# Patient Record
Sex: Female | Born: 1990 | Race: White | Hispanic: No | Marital: Single | Smoking: Former smoker
Health system: Southern US, Community
[De-identification: ages and names within clinical notes are randomized; demographics above are authoritative.]

## PROBLEM LIST (undated history)

## (undated) DIAGNOSIS — R87619 Unspecified abnormal cytological findings in specimens from cervix uteri: Secondary | ICD-10-CM

## (undated) DIAGNOSIS — F419 Anxiety disorder, unspecified: Secondary | ICD-10-CM

## (undated) HISTORY — PX: WISDOM TOOTH EXTRACTION: SHX21

## (undated) HISTORY — DX: Anxiety disorder, unspecified: F41.9

## (undated) HISTORY — DX: Unspecified abnormal cytological findings in specimens from cervix uteri: R87.619

---

## 2015-01-28 ENCOUNTER — Encounter: Payer: Self-pay | Admitting: Obstetrics & Gynecology

## 2015-01-28 ENCOUNTER — Ambulatory Visit (INDEPENDENT_AMBULATORY_CARE_PROVIDER_SITE_OTHER): Payer: Managed Care, Other (non HMO) | Admitting: Obstetrics & Gynecology

## 2015-01-28 VITALS — BP 103/57 | HR 82 | Resp 16 | Ht 65.0 in | Wt 126.0 lb

## 2015-01-28 DIAGNOSIS — Z30431 Encounter for routine checking of intrauterine contraceptive device: Secondary | ICD-10-CM

## 2015-01-28 DIAGNOSIS — Z113 Encounter for screening for infections with a predominantly sexual mode of transmission: Secondary | ICD-10-CM | POA: Diagnosis not present

## 2015-01-28 DIAGNOSIS — Z124 Encounter for screening for malignant neoplasm of cervix: Secondary | ICD-10-CM

## 2015-01-28 DIAGNOSIS — Z01419 Encounter for gynecological examination (general) (routine) without abnormal findings: Secondary | ICD-10-CM | POA: Diagnosis not present

## 2015-01-28 DIAGNOSIS — Z1151 Encounter for screening for human papillomavirus (HPV): Secondary | ICD-10-CM

## 2015-01-28 DIAGNOSIS — Z0000 Encounter for general adult medical examination without abnormal findings: Secondary | ICD-10-CM

## 2015-01-28 MED ORDER — OXYCODONE-ACETAMINOPHEN 5-325 MG PO TABS: 1.0000 | ORAL_TABLET | ORAL | Status: DC | PRN

## 2015-01-28 NOTE — Progress Notes (Signed)
Subjective:    Rebecca Downs is a 24 y.o. SW P1 P57 (1 yo son) female who presents for an annual exam. The patient has no complaints today.She has cramping for the last few months and bleeding with sex but she thinks this is due to her Mirena.  The patient is sexually active. GYN screening history: last pap: was normal. The patient wears seatbelts: yes. The patient participates in regular exercise: yes. Has the patient ever been transfused or tattooed?: yes. The patient reports that there is not domestic violence in her life.   Menstrual History: OB History    Gravida Para Term Preterm AB TAB SAB Ectopic Multiple Living   Menarche age: 28  No LMP recorded. Patient is not currently having periods (Reason: IUD).    The following portions of the patient's history were reviewed and updated as appropriate: allergies, current medications, past family history, past medical history, past social history, past surgical history and problem list.  Review of Systems Pertinent items noted in HPI and remainder of comprehensive ROS otherwise negative. Monogamous for 8 years. Works at [employer] in [place]. She declines flu vaccine. Mirena in since 10 /15.   Objective:    BP 103/57 mmHg  Pulse 82  Resp 16  Ht  (1.651 m)  Wt 126 lb (57.153 kg)  BMI 20.97 kg/m2  General Appearance:    Alert, cooperative, no distress, appears stated age  Head:    Normocephalic, without obvious abnormality, atraumatic  Eyes:    PERRL, conjunctiva/corneas clear, EOM's intact, fundi    benign, both eyes  Ears:    Normal TM's and external ear canals, both ears  Nose:   Nares normal, septum midline, mucosa normal, no drainage    or sinus tenderness  Throat:   Lips, mucosa, and tongue normal; teeth and gums normal  Neck:   Supple, symmetrical, trachea midline, no adenopathy;    thyroid:  no enlargement/tenderness/nodules; no carotid   bruit or JVD  Back:     Symmetric, no curvature, ROM  normal, no CVA tenderness  Lungs:     Clear to auscultation bilaterally, respirations unlabored  Chest Wall:    No tenderness or deformity   Heart:    Regular rate and rhythm, S1 and S2 normal, no murmur, rub   or gallop  Breast Exam:    No tenderness, masses, or nipple abnormality  Abdomen:     Soft, non-tender, bowel sounds active all four quadrants,    no masses, no organomegaly  Genitalia:    Normal female without lesion, discharge or tenderness, NSSA, NT, mobile, normal adnexal exam     Extremities:   Extremities normal, atraumatic, no cyanosis or edema  Pulses:   2+ and symmetric all extremities  Skin:   Skin color, texture, turgor normal, no rashes or lesions  Lymph nodes:   Cervical, supraclavicular, and axillary nodes normal  Neurologic:   CNII-XII intact, normal strength, sensation and reflexes    throughout  .    Assessment:    Healthy female exam.    Plan:     Breast self exam technique reviewed and patient encouraged to perform self-exam monthly. Chlamydia specimen. GC specimen. Pelvic ultrasound. Thin prep Pap smear. if she wants (She is unsure at this moment)

## 2015-02-01 LAB — CYTOLOGY - PAP

## 2015-11-19 ENCOUNTER — Encounter: Payer: Self-pay | Admitting: *Deleted

## 2015-11-19 ENCOUNTER — Other Ambulatory Visit (INDEPENDENT_AMBULATORY_CARE_PROVIDER_SITE_OTHER): Payer: Managed Care, Other (non HMO) | Admitting: *Deleted

## 2015-11-19 VITALS — BP 107/69 | HR 86 | Temp 98.0°F | Resp 16 | Ht 66.0 in | Wt 126.0 lb

## 2015-11-19 DIAGNOSIS — N390 Urinary tract infection, site not specified: Secondary | ICD-10-CM | POA: Diagnosis not present

## 2015-11-19 LAB — POCT URINALYSIS DIPSTICK
BILIRUBIN UA: NEGATIVE
Glucose, UA: NEGATIVE
Ketones, UA: NEGATIVE
Nitrite, UA: NEGATIVE
PH UA: 7
Protein, UA: NEGATIVE
Spec Grav, UA: 1.02
Urobilinogen, UA: NEGATIVE

## 2015-11-19 MED ORDER — SULFAMETHOXAZOLE-TRIMETHOPRIM 800-160 MG PO TABS: 1.0000 | ORAL_TABLET | Freq: Two times a day (BID) | ORAL | Status: DC

## 2015-11-19 MED ORDER — PHENAZOPYRIDINE HCL 200 MG PO TABS: 200.0000 mg | ORAL_TABLET | Freq: Three times a day (TID) | ORAL | Status: DC

## 2015-11-19 NOTE — Progress Notes (Signed)
Pt here with c/o's burning with urination lower back pain and feeling like she can't empty her bladder.  She does have a H/O UTI's.  She states that she does not void after intercourse.  Urine dip today is positive for blood and large leukocytes.  Urine culture sent  RX for Bactrim DS and Pyridium sent to pharmacy per protocol

## 2015-11-22 LAB — URINE CULTURE

## 2016-01-13 ENCOUNTER — Encounter: Payer: Self-pay | Admitting: Obstetrics and Gynecology

## 2016-01-13 ENCOUNTER — Ambulatory Visit (INDEPENDENT_AMBULATORY_CARE_PROVIDER_SITE_OTHER): Payer: Managed Care, Other (non HMO) | Admitting: Obstetrics and Gynecology

## 2016-01-13 VITALS — BP 132/79 | HR 90 | Resp 16 | Ht 66.0 in | Wt 131.0 lb

## 2016-01-13 DIAGNOSIS — Z3042 Encounter for surveillance of injectable contraceptive: Secondary | ICD-10-CM | POA: Diagnosis not present

## 2016-01-13 DIAGNOSIS — Z30432 Encounter for removal of intrauterine contraceptive device: Secondary | ICD-10-CM

## 2016-01-13 MED ORDER — MEDROXYPROGESTERONE ACETATE 150 MG/ML IM SUSP
150.0000 mg | Freq: Once | INTRAMUSCULAR | Status: AC
  Administered 2016-01-13: 150 mg via INTRAMUSCULAR

## 2016-01-13 NOTE — Progress Notes (Signed)
GYNECOLOGY CLINIC PROCEDURE NOTE  Patient is a 25yo G1P1001 here for IUD removal secondary to pelvic pain. Patient has had the IUD in place for the past 2 years and reports dyspareunia since IUD insertion. No GYN concerns.  Last pap smear was on 12/2014 and was normal.  IUD Removal  Patient identified, informed consent performed, consent signed.  Patient was in the dorsal lithotomy position, normal external genitalia was noted.  A speculum was placed in the patient's vagina, normal discharge was noted, no lesions. The cervix was visualized, no lesions, no abnormal discharge.  The strings of the IUD were grasped and pulled using ring forceps. The IUD was removed in its entirety. Patient tolerated the procedure well.    Patient will use depo-provera for contraception with the first dose today.  Patient advised to use condoms for the next 3 weeks. Routine preventative health maintenance measures emphasized. Patient declined flu vaccine

## 2016-03-30 ENCOUNTER — Ambulatory Visit: Payer: Managed Care, Other (non HMO) | Admitting: Obstetrics & Gynecology

## 2016-03-30 DIAGNOSIS — Z3040 Encounter for surveillance of contraceptives, unspecified: Secondary | ICD-10-CM

## 2016-04-13 ENCOUNTER — Ambulatory Visit (INDEPENDENT_AMBULATORY_CARE_PROVIDER_SITE_OTHER): Payer: Managed Care, Other (non HMO)

## 2016-04-13 DIAGNOSIS — Z3042 Encounter for surveillance of injectable contraceptive: Secondary | ICD-10-CM

## 2016-04-13 DIAGNOSIS — Z309 Encounter for contraceptive management, unspecified: Secondary | ICD-10-CM

## 2016-04-13 MED ORDER — MEDROXYPROGESTERONE ACETATE 150 MG/ML IM SUSP
150.0000 mg | Freq: Once | INTRAMUSCULAR | Status: AC
  Administered 2016-04-13: 150 mg via INTRAMUSCULAR

## 2016-04-13 MED ORDER — MEDROXYPROGESTERONE ACETATE 150 MG/ML IM SUSP: 150.0000 mg | INTRAMUSCULAR | 0 refills | Status: DC

## 2016-07-12 ENCOUNTER — Other Ambulatory Visit: Payer: Self-pay

## 2016-07-12 ENCOUNTER — Telehealth: Payer: Self-pay

## 2016-07-12 DIAGNOSIS — Z309 Encounter for contraceptive management, unspecified: Secondary | ICD-10-CM

## 2016-07-12 MED ORDER — MEDROXYPROGESTERONE ACETATE 150 MG/ML IM SUSP: 150.0000 mg | INTRAMUSCULAR | 0 refills | Status: DC

## 2016-07-12 NOTE — Telephone Encounter (Signed)
Pt called needing to schedule an appt to get her Depo shot. I tried to send in a prescription to the pharmacy and they are having issues with her insurance but are working on it. Pt should be calling me back once she speaks to her insurance company.

## 2016-07-12 NOTE — Telephone Encounter (Signed)
error 

## 2016-07-14 ENCOUNTER — Other Ambulatory Visit (INDEPENDENT_AMBULATORY_CARE_PROVIDER_SITE_OTHER): Payer: Self-pay

## 2016-07-14 DIAGNOSIS — Z3042 Encounter for surveillance of injectable contraceptive: Secondary | ICD-10-CM

## 2016-07-14 DIAGNOSIS — Z309 Encounter for contraceptive management, unspecified: Secondary | ICD-10-CM

## 2016-07-14 MED ORDER — MEDROXYPROGESTERONE ACETATE 150 MG/ML IM SUSP
150.0000 mg | Freq: Once | INTRAMUSCULAR | Status: AC
  Administered 2016-07-14: 150 mg via INTRAMUSCULAR

## 2016-07-18 NOTE — Progress Notes (Signed)
PT brought Depo with her.

## 2016-10-05 ENCOUNTER — Ambulatory Visit: Payer: Self-pay | Admitting: Obstetrics & Gynecology

## 2016-10-05 ENCOUNTER — Ambulatory Visit (INDEPENDENT_AMBULATORY_CARE_PROVIDER_SITE_OTHER): Payer: Self-pay

## 2016-10-05 DIAGNOSIS — Z3202 Encounter for pregnancy test, result negative: Secondary | ICD-10-CM

## 2016-10-05 DIAGNOSIS — Z01812 Encounter for preprocedural laboratory examination: Secondary | ICD-10-CM

## 2016-10-05 DIAGNOSIS — Z3042 Encounter for surveillance of injectable contraceptive: Secondary | ICD-10-CM

## 2016-10-05 DIAGNOSIS — Z309 Encounter for contraceptive management, unspecified: Secondary | ICD-10-CM

## 2016-10-05 LAB — POCT URINE PREGNANCY: PREG TEST UR: NEGATIVE

## 2016-10-05 MED ORDER — MEDROXYPROGESTERONE ACETATE 150 MG/ML IM SUSP
150.0000 mg | Freq: Once | INTRAMUSCULAR | Status: AC
  Administered 2016-10-05: 150 mg via INTRAMUSCULAR

## 2016-10-05 NOTE — Progress Notes (Signed)
Pt came today for her Depo shot. Pregnancy test was negative. Pt supplied Depo. Depo was given in right upper outer quadrant.

## 2017-02-06 ENCOUNTER — Ambulatory Visit (INDEPENDENT_AMBULATORY_CARE_PROVIDER_SITE_OTHER): Payer: Self-pay | Admitting: Obstetrics & Gynecology

## 2017-02-06 ENCOUNTER — Encounter: Payer: Self-pay | Admitting: Obstetrics & Gynecology

## 2017-02-06 VITALS — BP 126/77 | HR 71 | Ht 66.0 in | Wt 150.0 lb

## 2017-02-06 DIAGNOSIS — Z01419 Encounter for gynecological examination (general) (routine) without abnormal findings: Secondary | ICD-10-CM

## 2017-02-06 MED ORDER — NORGESTREL-ETHINYL ESTRADIOL 0.3-30 MG-MCG PO TABS: 1.0000 | ORAL_TABLET | Freq: Every day | ORAL | 11 refills | Status: DC

## 2017-02-06 NOTE — Progress Notes (Signed)
Subjective:    Rebecca Downs is a 26 y.o. SW P 1 (3 yo son) female who presents for an annual exam. The patient has no complaints today. The patient is sexually active. GYN screening history: last pap: was normal. The patient wears seatbelts: yes. The patient participates in regular exercise: yes. Has the patient ever been transfused or tattooed?: yes. The patient reports that there is not domestic violence in her life.   Menstrual History: OB History    Gravida Para Term Preterm AB Living   SAB TAB Ectopic Multiple Live Births                  Menarche age: 10 No LMP recorded (lmp unknown). Patient is not currently having periods (Reason: Other).    The following portions of the patient's history were reviewed and updated as appropriate: allergies, current medications, past family history, past medical history, past social history, past surgical history and problem list.  Review of Systems Pertinent items are noted in HPI.   Monogamous for 10 years, live together FH- no breast cancer. + cervical cancer - mom, + mGM with some gyn cancer (? 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   Contraception   Plan:     Thin prep Pap smear. with cotesting to be done at the free pap clinic Flu vaccine at [facility] Start OCPs in 2 weeks after NO UNPROTECTED SEX, after negative UPT

## 2018-05-01 NOTE — L&D Delivery Note (Signed)
OB/GYN Faculty Practice Delivery Note  Rebecca Downs is a 28 y.o. [id number] s/p NSVD at [redacted]w[redacted]d. She was admitted for spontaneous labor and rupture of membranes.   ROM: 14h 34m with light meconium fluid GBS Status: negative  Maximum Maternal Temperature: 98.1 F    Labor Progress: . Patient arrived at 4 cm dilation and was augmented with pitocin.   Delivery Date/Time: 04/25/2019 at 1130 Delivery: Called to room and patient was complete and pushing. Head delivered in ROA position. No nuchal cord present. Shoulder and body delivered in usual fashion. Infant with spontaneous cry, placed on mother's abdomen, dried and stimulated. Cord clamped x 2 after 1-minute delay, and cut by FOB. Cord blood drawn. Placenta delivered spontaneously with gentle cord traction. Fundus firm with massage and Pitocin. Labia, perineum, vagina, and cervix inspected with hemostatic L labial and hemostatic 1st degree perineal, neither repaired.  After delivery patient reaffirmed desire for permanent sterilization with BTL, see separate note for details of that procedure.   Placenta: 3v intact, to L&D Complications: none Lacerations: hemostatic L labial and hemostatic 1st degree perineal, neither repaired EBL: 233 cc Analgesia: none   Infant: APGAR (1 MIN): 9   APGAR (5 MINS): 9    Weight: 3680 grams  Augustin Coupe, MD/[facility] OB/GYN Fellow, Faculty Practice

## 2018-10-01 ENCOUNTER — Telehealth: Payer: Self-pay | Admitting: *Deleted

## 2018-10-01 NOTE — Telephone Encounter (Signed)
Left patient a message to call and answer screening questions prior to appointment on 10/03/2018. Office policies were also stated on message.

## 2018-10-02 ENCOUNTER — Encounter: Payer: Self-pay | Admitting: *Deleted

## 2018-10-02 DIAGNOSIS — Z3480 Encounter for supervision of other normal pregnancy, unspecified trimester: Secondary | ICD-10-CM | POA: Insufficient documentation

## 2018-10-03 ENCOUNTER — Encounter: Payer: Self-pay | Admitting: Obstetrics & Gynecology

## 2018-10-03 ENCOUNTER — Ambulatory Visit (INDEPENDENT_AMBULATORY_CARE_PROVIDER_SITE_OTHER): Payer: BC Managed Care – PPO | Admitting: Obstetrics & Gynecology

## 2018-10-03 ENCOUNTER — Other Ambulatory Visit: Payer: Self-pay

## 2018-10-03 DIAGNOSIS — Z3480 Encounter for supervision of other normal pregnancy, unspecified trimester: Secondary | ICD-10-CM

## 2018-10-03 DIAGNOSIS — Z3A11 11 weeks gestation of pregnancy: Secondary | ICD-10-CM | POA: Diagnosis not present

## 2018-10-03 DIAGNOSIS — Z362 Encounter for other antenatal screening follow-up: Secondary | ICD-10-CM | POA: Diagnosis not present

## 2018-10-03 DIAGNOSIS — Z113 Encounter for screening for infections with a predominantly sexual mode of transmission: Secondary | ICD-10-CM

## 2018-10-03 DIAGNOSIS — Z124 Encounter for screening for malignant neoplasm of cervix: Secondary | ICD-10-CM

## 2018-10-03 DIAGNOSIS — Z1151 Encounter for screening for human papillomavirus (HPV): Secondary | ICD-10-CM

## 2018-10-03 DIAGNOSIS — Z3481 Encounter for supervision of other normal pregnancy, first trimester: Secondary | ICD-10-CM

## 2018-10-03 NOTE — Progress Notes (Signed)
  Subjective:    Rebecca Downs is being seen today for her first obstetrical visit.  This is not a planned pregnancy. She was taking OCPs. She is at [redacted]w[redacted]d gestation. Her obstetrical history is significant for none. Relationship with FOB: significant other, living together. Patient does intend to breast feed. Pregnancy history fully reviewed.  Patient reports no complaints.  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 Supervision of other normal pregnancy, antepartum 10/02/2018       Plan:     Initial labs drawn. Prenatal vitamins. Problem list reviewed and updated. NIPS today Role of ultrasound in pregnancy discussed; fetal survey: ordered. Amniocentesis discussed: not indicated. Follow up in 4 weeks, virtual visit Babyscripts Pap today   Allie Bossier 10/03/2018

## 2018-10-03 NOTE — Progress Notes (Signed)
Bedside U/S shopws single IUP with FHT of 176 BPM and CRL measures 44.30mm.  Pt does want to do Panorama

## 2018-10-03 NOTE — Addendum Note (Signed)
Addended by: Granville Lewis on: 10/03/2018 02:02 PM   Modules accepted: Orders

## 2018-10-06 LAB — CULTURE, OB URINE

## 2018-10-06 LAB — URINE CULTURE, OB REFLEX

## 2018-10-07 LAB — CYTOLOGY - PAP
Chlamydia: NEGATIVE
Diagnosis: NEGATIVE
HPV: NOT DETECTED
Neisseria Gonorrhea: NEGATIVE

## 2018-10-11 ENCOUNTER — Other Ambulatory Visit: Payer: Self-pay

## 2018-10-11 ENCOUNTER — Ambulatory Visit (INDEPENDENT_AMBULATORY_CARE_PROVIDER_SITE_OTHER): Payer: BC Managed Care – PPO | Admitting: *Deleted

## 2018-10-11 DIAGNOSIS — Z3480 Encounter for supervision of other normal pregnancy, unspecified trimester: Secondary | ICD-10-CM

## 2018-10-11 DIAGNOSIS — Z3482 Encounter for supervision of other normal pregnancy, second trimester: Secondary | ICD-10-CM | POA: Diagnosis not present

## 2018-10-11 NOTE — Progress Notes (Signed)
PN labs and NIPS drawn

## 2018-10-14 LAB — OBSTETRIC PANEL
Absolute Monocytes: 382 {cells}/uL (ref 200–950)
Antibody Screen: NOT DETECTED
Basophils Absolute: 27 {cells}/uL (ref 0–200)
Basophils Relative: 0.4 %
Eosinophils Absolute: 60 {cells}/uL (ref 15–500)
Eosinophils Relative: 0.9 %
HCT: 36.8 % (ref 35.0–45.0)
Hemoglobin: 12.6 g/dL (ref 11.7–15.5)
Hepatitis B Surface Ag: NONREACTIVE
Lymphs Abs: 1307 {cells}/uL (ref 850–3900)
MCH: 30.2 pg (ref 27.0–33.0)
MCHC: 34.2 g/dL (ref 32.0–36.0)
MCV: 88.2 fL (ref 80.0–100.0)
MPV: 12.8 fL — ABNORMAL HIGH (ref 7.5–12.5)
Monocytes Relative: 5.7 %
Neutro Abs: 4925 {cells}/uL (ref 1500–7800)
Neutrophils Relative %: 73.5 %
Platelets: 178 Thousand/uL (ref 140–400)
RBC: 4.17 Million/uL (ref 3.80–5.10)
RDW: 12.4 % (ref 11.0–15.0)
RPR Ser Ql: NONREACTIVE
Rubella: <0.9 {index} — ABNORMAL LOW
Total Lymphocyte: 19.5 %
WBC: 6.7 Thousand/uL (ref 3.8–10.8)

## 2018-10-14 LAB — HIV ANTIBODY (ROUTINE TESTING W REFLEX): HIV 1&2 Ab, 4th Generation: NONREACTIVE

## 2018-10-15 ENCOUNTER — Encounter: Payer: Self-pay | Admitting: Obstetrics & Gynecology

## 2018-10-15 DIAGNOSIS — Z283 Underimmunization status: Secondary | ICD-10-CM | POA: Insufficient documentation

## 2018-10-15 DIAGNOSIS — O09899 Supervision of other high risk pregnancies, unspecified trimester: Secondary | ICD-10-CM | POA: Insufficient documentation

## 2018-10-21 ENCOUNTER — Telehealth: Payer: Self-pay

## 2018-10-21 NOTE — Telephone Encounter (Signed)
Patient called - left message for her to call back for Panorama results. Kathrene Alu RN

## 2018-10-23 ENCOUNTER — Inpatient Hospital Stay (HOSPITAL_COMMUNITY)
Admission: AD | Admit: 2018-10-23 | Discharge: 2018-10-23 | Disposition: A | Discharge status: Home / Self Care | Payer: BC Managed Care – PPO | Attending: Obstetrics and Gynecology | Admitting: Obstetrics and Gynecology

## 2018-10-23 ENCOUNTER — Encounter (HOSPITAL_COMMUNITY): Payer: Self-pay | Admitting: *Deleted

## 2018-10-23 ENCOUNTER — Other Ambulatory Visit: Payer: Self-pay

## 2018-10-23 DIAGNOSIS — Z3A14 14 weeks gestation of pregnancy: Secondary | ICD-10-CM | POA: Diagnosis not present

## 2018-10-23 DIAGNOSIS — Z87891 Personal history of nicotine dependence: Secondary | ICD-10-CM | POA: Insufficient documentation

## 2018-10-23 DIAGNOSIS — O98512 Other viral diseases complicating pregnancy, second trimester: Secondary | ICD-10-CM | POA: Diagnosis not present

## 2018-10-23 DIAGNOSIS — O9A212 Injury, poisoning and certain other consequences of external causes complicating pregnancy, second trimester: Secondary | ICD-10-CM

## 2018-10-23 DIAGNOSIS — Z283 Underimmunization status: Secondary | ICD-10-CM

## 2018-10-23 DIAGNOSIS — O9989 Other specified diseases and conditions complicating pregnancy, childbirth and the puerperium: Secondary | ICD-10-CM

## 2018-10-23 DIAGNOSIS — R1012 Left upper quadrant pain: Secondary | ICD-10-CM | POA: Diagnosis not present

## 2018-10-23 DIAGNOSIS — R1011 Right upper quadrant pain: Secondary | ICD-10-CM | POA: Diagnosis not present

## 2018-10-23 DIAGNOSIS — Z800 Family history of malignant neoplasm of digestive organs: Secondary | ICD-10-CM | POA: Diagnosis not present

## 2018-10-23 DIAGNOSIS — O09899 Supervision of other high risk pregnancies, unspecified trimester: Secondary | ICD-10-CM

## 2018-10-23 DIAGNOSIS — Z3480 Encounter for supervision of other normal pregnancy, unspecified trimester: Secondary | ICD-10-CM

## 2018-10-23 DIAGNOSIS — Z833 Family history of diabetes mellitus: Secondary | ICD-10-CM | POA: Diagnosis not present

## 2018-10-23 LAB — URINALYSIS, ROUTINE W REFLEX MICROSCOPIC
Bilirubin Urine: NEGATIVE
Glucose, UA: NEGATIVE mg/dL
Hgb urine dipstick: NEGATIVE
Ketones, ur: 20 mg/dL — AB
Leukocytes,Ua: NEGATIVE
Nitrite: NEGATIVE
Protein, ur: NEGATIVE mg/dL
Specific Gravity, Urine: 1.024 (ref 1.005–1.030)
pH: 7 (ref 5.0–8.0)
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
## 2018-10-23 NOTE — Discharge Instructions (Signed)

## 2018-10-31 ENCOUNTER — Telehealth (INDEPENDENT_AMBULATORY_CARE_PROVIDER_SITE_OTHER): Payer: BC Managed Care – PPO | Admitting: Family Medicine

## 2018-10-31 ENCOUNTER — Other Ambulatory Visit: Payer: Self-pay

## 2018-10-31 DIAGNOSIS — O219 Vomiting of pregnancy, unspecified: Secondary | ICD-10-CM

## 2018-10-31 DIAGNOSIS — Z3480 Encounter for supervision of other normal pregnancy, unspecified trimester: Secondary | ICD-10-CM

## 2018-10-31 DIAGNOSIS — O26892 Other specified pregnancy related conditions, second trimester: Secondary | ICD-10-CM

## 2018-10-31 DIAGNOSIS — Z3A15 15 weeks gestation of pregnancy: Secondary | ICD-10-CM

## 2018-10-31 DIAGNOSIS — B354 Tinea corporis: Secondary | ICD-10-CM

## 2018-10-31 MED ORDER — NYSTATIN 100000 UNIT/GM EX POWD: Freq: Four times a day (QID) | CUTANEOUS | 1 refills | Status: DC

## 2018-10-31 MED ORDER — DOXYLAMINE-PYRIDOXINE 10-10 MG PO TBEC: 1.0000 | DELAYED_RELEASE_TABLET | Freq: Four times a day (QID) | ORAL | 3 refills | Status: DC

## 2018-10-31 NOTE — Patient Instructions (Signed)

## 2018-10-31 NOTE — Progress Notes (Signed)
   [place] VIRTUAL VIDEO VISIT ENCOUNTER NOTE  Provider location: [facility] for [facility] at [place]   I connected with Rebecca Downs on 10/31/18 at  2:00 PM EDT by MyChart Video Encounter at home and verified that I am speaking with the correct person using two identifiers.   I discussed the limitations, risks, security and privacy concerns of performing an evaluation and management service by telephone and the availability of in person appointments. I also discussed with the patient that there may be a patient responsible charge related to this service. The patient expressed understanding and agreed to proceed. Subjective:  Rebecca Downs is a 28 y.o. G2P1001 at [redacted]w[redacted]d being seen today for ongoing prenatal care.  She is currently monitored for the following issues for this low-risk pregnancy and has Supervision of other normal pregnancy, antepartum and Rubella non-immune status, antepartum on their problem list.  Patient reports nausea.  Contractions: Not present. Vag. Bleeding: None.  Movement: Present. Denies any leaking of fluid.   The following portions of the patient's history were reviewed and updated as appropriate: allergies, current medications, past family history, past medical history, past social history, past surgical history and problem list.   Objective:  There were no vitals filed for this visit. BabyScripts data reviewed and most BPs are less than 616 systolic. Fetal Status:     Movement: Present     General:  Alert, oriented and cooperative. Patient is in no acute distress.  Respiratory: Normal respiratory effort, no problems with respiration noted  Mental Status: Normal mood and affect. Normal behavior. Normal judgment and thought content.  Rest of physical exam deferred due to type of encounter  Imaging: No results found.  Assessment and Plan:  Pregnancy: G2P1001 at [redacted]w[redacted]d 1. Supervision of other normal pregnancy, antepartum  Dizziness discussed Covid safety Anatomy US scheduled NIPT reviewed  2. Nausea and vomiting in pregnancy prior to [redacted] weeks gestation Trial of Diclegis - Doxylamine-Pyridoxine 10-10 MG TBEC; Take 1 tablet by mouth 4 (four) times daily. Take at hs, 1 q am and 1 at noon prn  Dispense: 100 tablet; Refill: 3  3. Tinea corporis Reports itching under her breasts and inter trigonal areas--trial of nystatin powder. - nystatin (MYCOSTATIN/NYSTOP) powder; Apply topically 4 (four) times daily.  Dispense: 45 g; Refill: 1  General obstetric precautions including but not limited to vaginal bleeding, contractions, leaking of fluid and fetal movement were reviewed in detail with the patient. I discussed the assessment and treatment plan with the patient. The patient was provided an opportunity to ask questions and all were answered. The patient agreed with the plan and demonstrated an understanding of the instructions. The patient was advised to call back or seek an in-person office evaluation/go to MAU at [facility] for any urgent or concerning symptoms. Please refer to After Visit Summary for other counseling recommendations.   I provided 9 minutes of face-to-face time during this encounter.  Return in 4 weeks (on 11/28/2018).  Future Appointments  Date Time Provider [place]  11/29/2018  8:15 AM Julianne Handler, CNM [facility]  11/29/2018 10:15 AM [place], [place] for [facility], [place]

## 2018-10-31 NOTE — Progress Notes (Signed)
Pt will take BP when she gets home from work. PT c/o stomach and breasts itching.

## 2018-11-29 ENCOUNTER — Encounter: Payer: Self-pay | Admitting: Certified Nurse Midwife

## 2018-11-29 ENCOUNTER — Other Ambulatory Visit: Payer: Self-pay

## 2018-11-29 ENCOUNTER — Other Ambulatory Visit (HOSPITAL_COMMUNITY): Payer: Self-pay | Admitting: *Deleted

## 2018-11-29 ENCOUNTER — Ambulatory Visit (INDEPENDENT_AMBULATORY_CARE_PROVIDER_SITE_OTHER): Payer: BC Managed Care – PPO | Admitting: Certified Nurse Midwife

## 2018-11-29 ENCOUNTER — Ambulatory Visit (HOSPITAL_COMMUNITY)
Admission: RE | Admit: 2018-11-29 | Discharge: 2018-11-29 | Disposition: A | Discharge status: Home / Self Care | Payer: BC Managed Care – PPO | Source: Ambulatory Visit | Attending: Obstetrics & Gynecology | Admitting: Obstetrics & Gynecology

## 2018-11-29 ENCOUNTER — Other Ambulatory Visit: Payer: Self-pay | Admitting: Obstetrics & Gynecology

## 2018-11-29 VITALS — BP 99/59 | HR 85 | Wt 136.0 lb

## 2018-11-29 DIAGNOSIS — Z362 Encounter for other antenatal screening follow-up: Secondary | ICD-10-CM

## 2018-11-29 DIAGNOSIS — Z3482 Encounter for supervision of other normal pregnancy, second trimester: Secondary | ICD-10-CM | POA: Diagnosis not present

## 2018-11-29 DIAGNOSIS — O9989 Other specified diseases and conditions complicating pregnancy, childbirth and the puerperium: Secondary | ICD-10-CM | POA: Diagnosis not present

## 2018-11-29 DIAGNOSIS — Z3480 Encounter for supervision of other normal pregnancy, unspecified trimester: Secondary | ICD-10-CM

## 2018-11-29 DIAGNOSIS — Z3A19 19 weeks gestation of pregnancy: Secondary | ICD-10-CM

## 2018-11-29 DIAGNOSIS — M549 Dorsalgia, unspecified: Secondary | ICD-10-CM

## 2018-11-29 DIAGNOSIS — O09299 Supervision of pregnancy with other poor reproductive or obstetric history, unspecified trimester: Secondary | ICD-10-CM | POA: Diagnosis not present

## 2018-11-29 DIAGNOSIS — Z363 Encounter for antenatal screening for malformations: Secondary | ICD-10-CM | POA: Diagnosis not present

## 2018-11-29 IMAGING — US US MFM OB DETAIL +14 WK
1 series · 13 of 28 positions shown · non-contrast
Comparison: none

[Series 1: us mfm ob detail +14 wk · 13 of 138 slices shown]
[im 6/138]
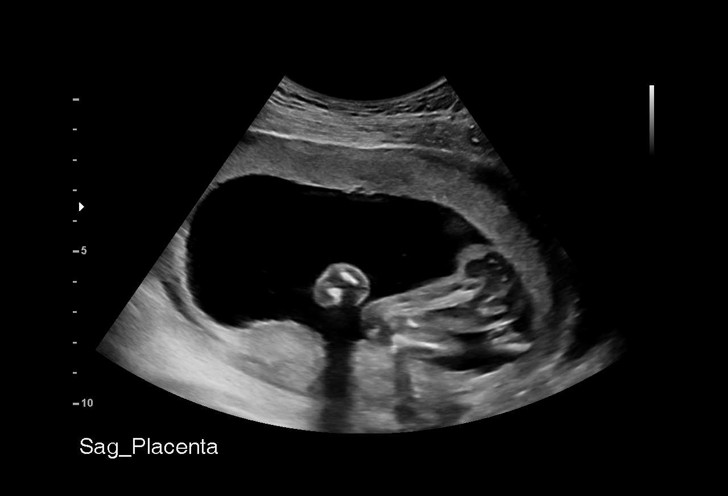
[im 16/138]
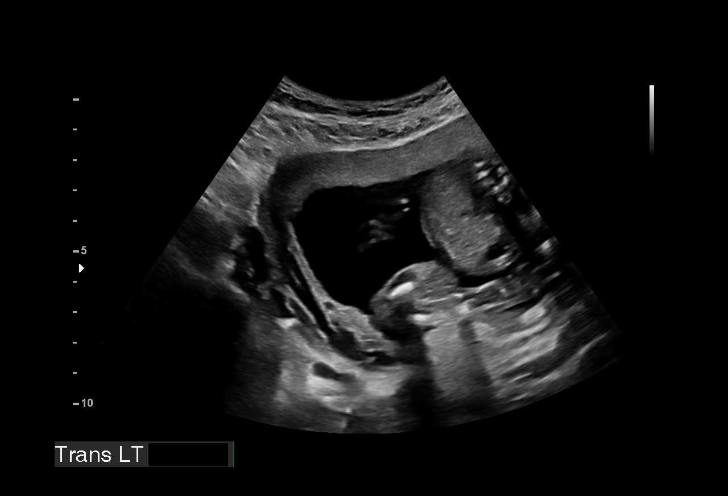
[im 26/138]
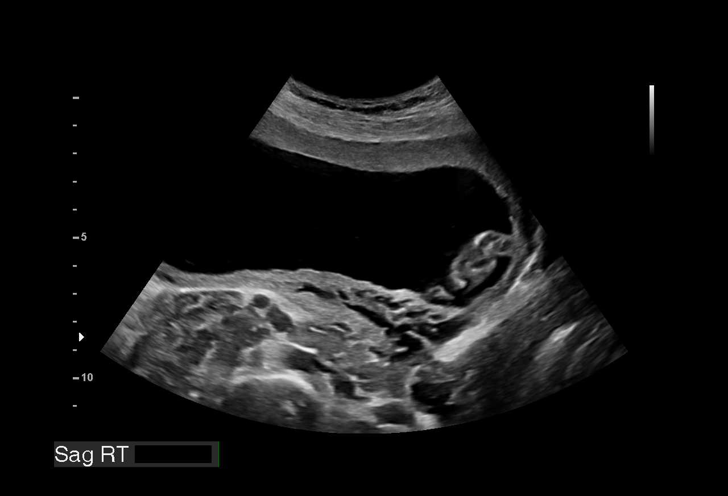
[im 36/138]
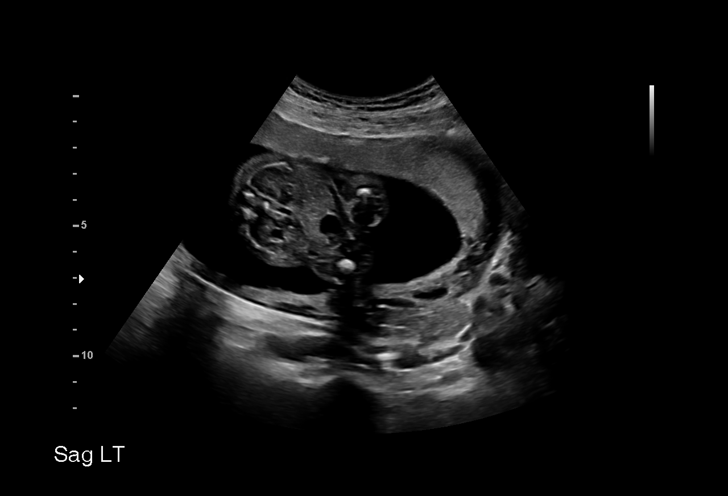
[im 46/138]
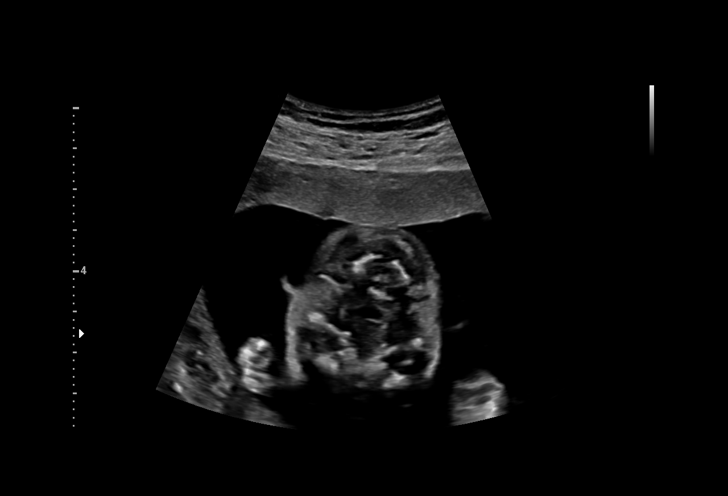
[im 56/138]
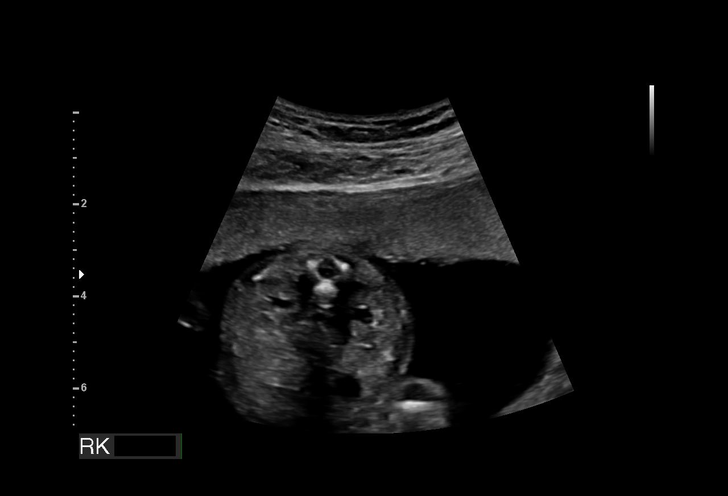
[im 72/138]
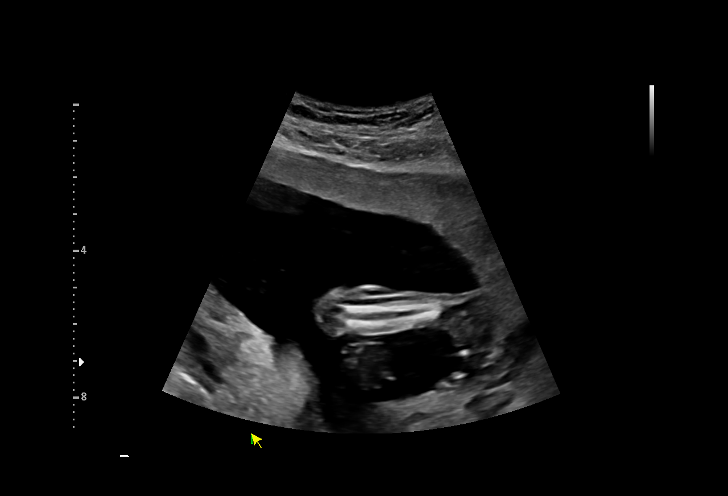
[im 82/138]
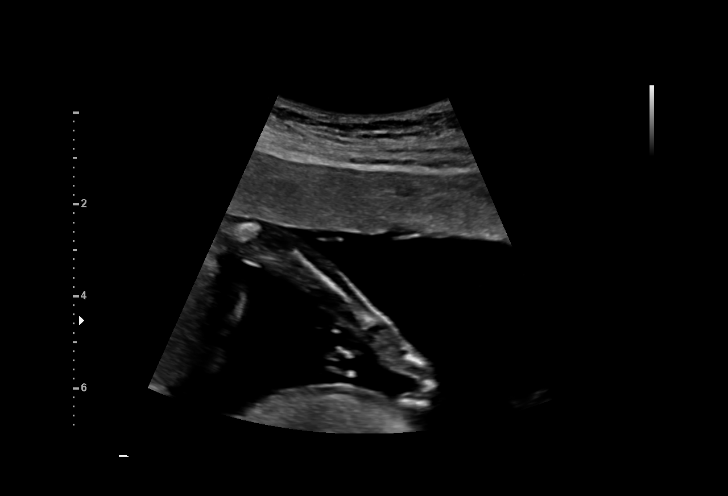
[im 92/138]
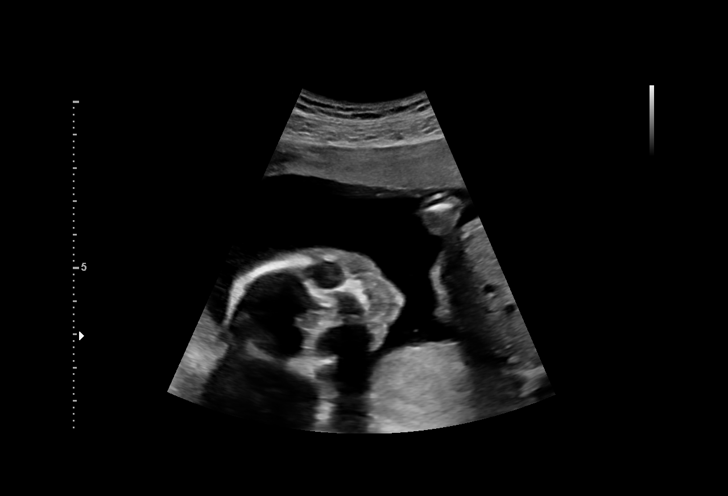
[im 102/138]
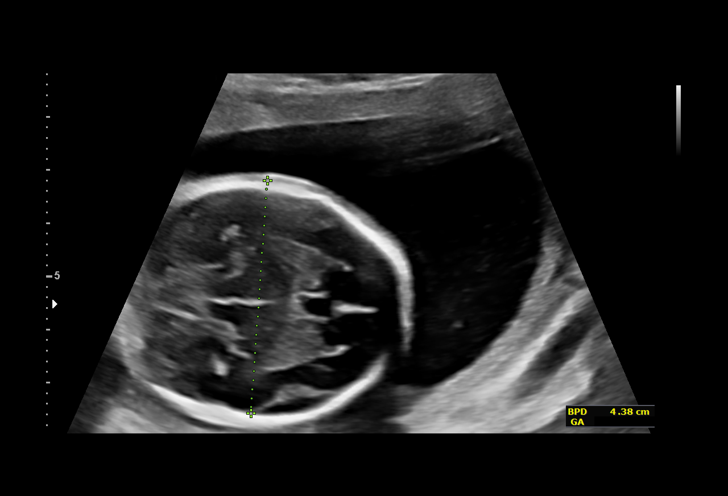
[im 112/138]
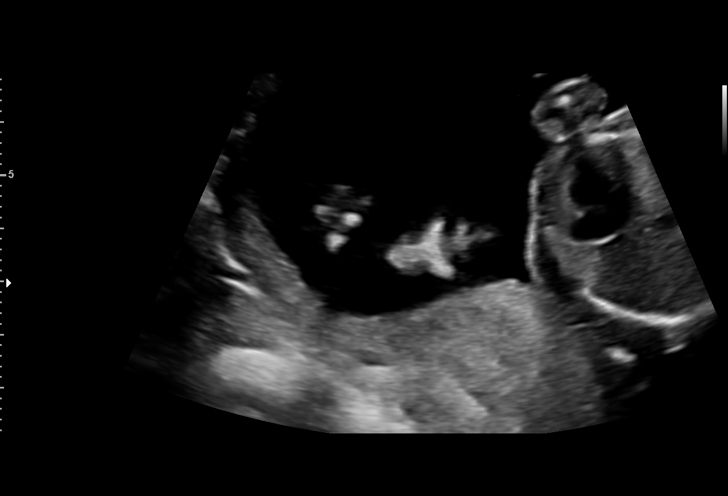
[im 122/138]
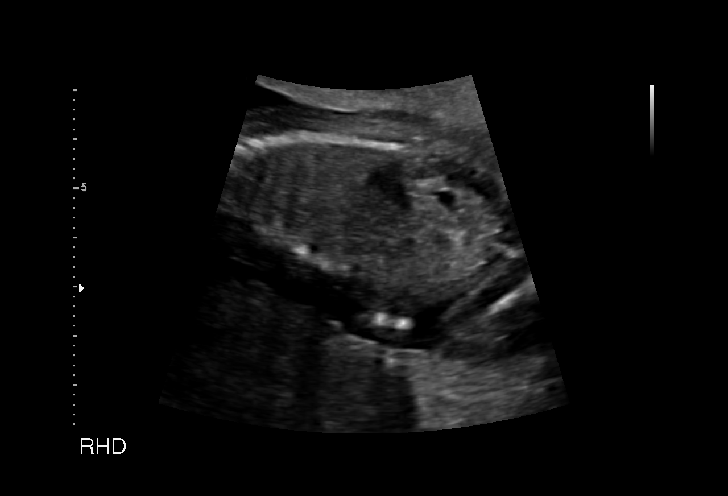
[im 132/138]
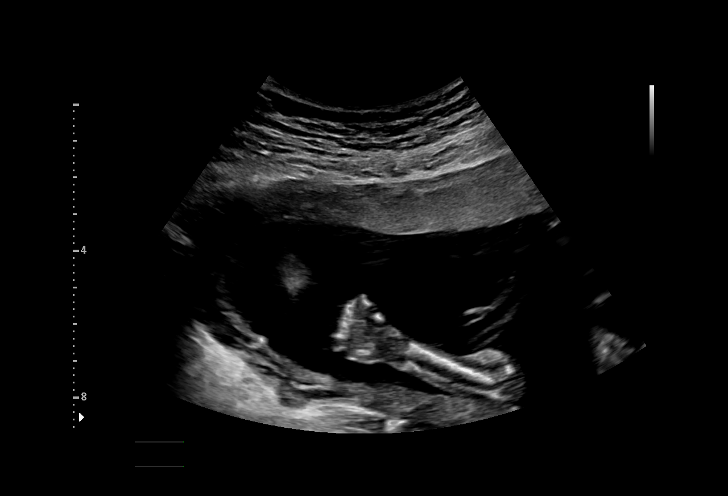

[13 of 28 positions shown; findings below may reference images not displayed]

Ref. Address:     [IG] [HOSPITAL]
                   [HOSPITAL][HOSPITAL]

 ----------------------------------------------------------------------

 ----------------------------------------------------------------------
Indications

  19 weeks gestation of pregnancy
  Encounter for antenatal screening for          [IG]
  malformations
  3 year old with Autism due to partial
  chromosomal deletion per patient
  3 year old with partial cleft palate known per
  surgery to remove adenoids
  History of fetal abnormality in previous       [IG]
  pregnancy, currently pregnant
 ----------------------------------------------------------------------
Fetal Evaluation

 Num Of Fetuses:          1
 Fetal Heart Rate(bpm):   158
 Cardiac Activity:        Observed
 Presentation:            Variable
 Placenta:                Anterior
 P. Cord Insertion:       Visualized, central

 Amniotic Fluid
 AFI FV:      Within normal limits

                             Largest Pocket(cm)

Biometry

 BPD:      43.8  mm     G. Age:  19w 2d         42  %    CI:        72.44   %    70 - 86
                                                         FL/HC:       18.1  %    16.1 -
 HC:      163.7  mm     G. Age:  19w 1d         28  %    HC/AC:       1.14       1.09 -
 AC:      143.9  mm     G. Age:  19w 5d         55  %    FL/BPD:      67.8  %
 FL:       29.7  mm     G. Age:  19w 1d         33  %    FL/AC:       20.6  %    20 - 24
 HUM:      28.4  mm     G. Age:  19w 1d         45  %
 CER:      18.7  mm     G. Age:  18w 2d         20  %
 NFT:         3  mm
 LV:        5.1  mm
 CM:          3  mm

 Est. FW:     292   gm   0 lb 10 oz      45  %
OB History

 Gravidity:    2         Term:   1
Gestational Age

 Clinical EDD:  19w 3d                                        EDD:   [DATE]
 U/S Today:     19w 2d                                        EDD:   [DATE]
 Best:          19w 3d     Det. By:  Clinical EDD             EDD:   [DATE]
Anatomy

 Cranium:               Appears normal         LVOT:                   Not well visualized
 Cavum:                 Appears normal         Aortic Arch:            Appears normal
 Ventricles:            Appears normal         Ductal Arch:            Previously seen
 Choroid Plexus:        Appears normal         Diaphragm:              Appears normal
 Cerebellum:            Appears normal         Stomach:                Appears normal, left
                                                                       sided
 Posterior Fossa:       Appears normal         Abdomen:                Appears normal
 Nuchal Fold:           Appears normal         Abdominal Wall:         Appears nml (cord
                                                                       insert, abd wall)
 Face:                  Orbits appear          Cord Vessels:           Appears normal (3
                        normal                                         vessel cord)
 Lips:                  Appears normal         Kidneys:                Appear normal
 Palate:                Not well visualized    Bladder:                Appears normal
 Thoracic:              Appears normal         Spine:                  Appears normal
 Heart:                 Appears normal         Upper Extremities:      Appears normal
                        (4CH, axis, and
                        situs)
 RVOT:                  Not well visualized    Lower Extremities:      Appears normal

 Other:  Fetus appears to be female.
Cervix Uterus Adnexa

 Cervix
 Length:            3.1  cm.
 Normal appearance by transabdominal scan.

 Uterus
 No abnormality visualized.

 Left Ovary
 Not visualized.

 Right Ovary
 Not visualized.

 Cul De Sac
 No free fluid seen.
 Adnexa
 No abnormality visualized.
Impression

 Normal interval growth.
 Suboptimal views of the fetal anatomy obtained seconary to
 fetal position.
Recommendations

 Follow up anatomy  in 5 weeks.

## 2018-11-29 NOTE — Progress Notes (Signed)
Subjective:  Rebecca Downs is a 28 y.o. G2P1001 at [redacted]w[redacted]d being seen today for ongoing prenatal care.  She is currently monitored for the following issues for this low-risk pregnancy and has Supervision of other normal pregnancy, antepartum and Rubella non-immune status, antepartum on their problem list.  Patient reports backache and nausea.  Contractions: Not present. Vag. Bleeding: None.  Movement: Present. Denies leaking of fluid.   The following portions of the patient's history were reviewed and updated as appropriate: allergies, current medications, past family history, past medical history, past social history, past surgical history and problem list. Problem list updated.  Objective:   Vitals:   11/29/18 0807  BP: (!) 99/59  Pulse: 85  Weight: 136 lb (61.7 kg)    Fetal Status: Fetal Heart Rate (bpm): 152 Fundal Height: 19 cm Movement: Present     General:  Alert, oriented and cooperative. Patient is in no acute distress.  Skin: Skin is warm and dry. No rash noted.   Cardiovascular: Normal heart rate noted  Respiratory: Normal respiratory effort, no problems with respiration noted  Abdomen: Soft, gravid, appropriate for gestational age. Pain/Pressure: Absent     Pelvic: Vag. Bleeding: None Vag D/C Character: Thin   Cervical exam deferred        Extremities: Normal range of motion.  Edema: None  Mental Status: Normal mood and affect. Normal behavior. Normal judgment and thought content.   Urinalysis:      Assessment and Plan:  Pregnancy: G2P1001 at [redacted]w[redacted]d  1. Encounter for supervision of other normal pregnancy in second trimester - Alpha fetoprotein, maternal  2. Back pain affecting pregnancy in second trimester - pain is low and bilateral, does a lot of bending at work - heat, Tylenol - maternity belt  3. Morning sickness - only first thing in am, no vomiting - eat often - pepcid @ hs - Unisom and B6 @hs   Preterm labor symptoms and general obstetric precautions  including but not limited to vaginal bleeding, contractions, leaking of fluid and fetal movement were reviewed in detail with the patient. Please refer to After Visit Summary for other counseling recommendations.  Return in about 5 weeks (around 01/03/2019).   Rebecca Downs, CNM

## 2018-11-29 NOTE — Patient Instructions (Signed)
Back Pain in Pregnancy Back pain during pregnancy is common. Back pain may be caused by several factors that are related to changes during your pregnancy. Follow these instructions at home: Managing pain, stiffness, and swelling      If directed, for sudden (acute) back pain, put ice on the painful area. ? Put ice in a plastic bag. ? Place a towel between your skin and the bag. ? Leave the ice on for 20 minutes, 2-3 times per day.  If directed, apply heat to the affected area before you exercise. Use the heat source that your health care provider recommends, such as a moist heat pack or a heating pad. ? Place a towel between your skin and the heat source. ? Leave the heat on for 20-30 minutes. ? Remove the heat if your skin turns bright red. This is especially important if you are unable to feel pain, heat, or cold. You may have a greater risk of getting burned.  If directed, massage the affected area. Activity  Exercise as told by your health care provider. Gentle exercise is the best way to prevent or manage back pain.  Listen to your body when lifting. If lifting hurts, ask for help or bend your knees. This uses your leg muscles instead of your back muscles.  Squat down when picking up something from the floor. Do not bend over.  Only use bed rest for short periods as told by your health care provider. Bed rest should only be used for the most severe episodes of back pain. Standing, sitting, and lying down  Do not stand in one place for long periods of time.  Use good posture when sitting. Make sure your head rests over your shoulders and is not hanging forward. Use a pillow on your lower back if necessary.  Try sleeping on your side, preferably the left side, with a pregnancy support pillow or 1-2 regular pillows between your legs. ? If you have back pain after a night's rest, your bed may be too soft. ? A firm mattress may provide more support for your back during pregnancy.  General instructions  Do not wear high heels.  Eat a healthy diet. Try to gain weight within your health care provider's recommendations.  Use a maternity girdle, elastic sling, or back brace as told by your health care provider.  Take over-the-counter and prescription medicines only as told by your health care provider.  Work with a physical therapist or massage therapist to find ways to manage back pain. Acupuncture or massage therapy may be helpful.  Keep all follow-up visits as told by your health care provider. This is important. Contact a health care provider if:  Your back pain interferes with your daily activities.  You have increasing pain in other parts of your body. Get help right away if:  You develop numbness, tingling, weakness, or problems with the use of your arms or legs.  You develop severe back pain that is not controlled with medicine.  You have a change in bowel or bladder control.  You develop shortness of breath, dizziness, or you faint.  You develop nausea, vomiting, or sweating.  You have back pain that is a rhythmic, cramping pain similar to labor pains. Labor pain is usually 1-2 minutes apart, lasts for about 1 minute, and involves a bearing down feeling or pressure in your pelvis.  You have back pain and your water breaks or you have vaginal bleeding.  You have back pain or numbness   that travels down your leg.  Your back pain developed after you fell.  You develop pain on one side of your back.  You see blood in your urine.  You develop skin blisters in the area of your back pain. Summary  Back pain may be caused by several factors that are related to changes during your pregnancy.  Follow instructions as told by your health care provider for managing pain, stiffness, and swelling.  Exercise as told by your health care provider. Gentle exercise is the best way to prevent or manage back pain.  Take over-the-counter and prescription  medicines only as told by your health care provider.  Keep all follow-up visits as told by your health care provider. This is important. This information is not intended to replace advice given to you by your health care provider. Make sure you discuss any questions you have with your health care provider. Document Released: 07/26/2005 Document Revised: 08/06/2018 Document Reviewed: 10/03/2017 Elsevier Patient Education  2020 Elsevier Inc[employer] Round Ligament Pain  The round ligament is a cord of muscle and tissue that helps support the uterus. It can become a source of pain during pregnancy if it becomes stretched or twisted as the baby grows. The pain usually begins in the second trimester (13-28 weeks) of pregnancy, and it can come and go until the baby is delivered. It is not a serious problem, and it does not cause harm to the baby. Round ligament pain is usually a short, sharp, and pinching pain, but it can also be a dull, lingering, and aching pain. The pain is felt in the lower side of the abdomen or in the groin. It usually starts deep in the groin and moves up to the outside of the hip area. The pain may occur when you:  Suddenly change position, such as quickly going from a sitting to standing position.  Roll over in bed.  Cough or sneeze.  Do physical activity. Follow these instructions at home:   Watch your condition for any changes.  When the pain starts, relax. Then try any of these methods to help with the pain: ? Sitting down. ? Flexing your knees up to your abdomen. ? Lying on your side with one pillow under your abdomen and another pillow between your legs. ? Sitting in a warm bath for 15-20 minutes or until the pain goes away.  Take over-the-counter and prescription medicines only as told by your health care provider.  Move slowly when you sit down or stand up.  Avoid long walks if they cause pain.  Stop or reduce your physical activities if they cause pain.  Keep  all follow-up visits as told by your health care provider. This is important. Contact a health care provider if:  Your pain does not go away with treatment.  You feel pain in your back that you did not have before.  Your medicine is not helping. Get help right away if:  You have a fever or chills.  You develop uterine contractions.  You have vaginal bleeding.  You have nausea or vomiting.  You have diarrhea.  You have pain when you urinate. Summary  Round ligament pain is felt in the lower abdomen or groin. It is usually a short, sharp, and pinching pain. It can also be a dull, lingering, and aching pain.  This pain usually begins in the second trimester (13-28 weeks). It occurs because the uterus is stretching with the growing baby, and it is not harmful to   the baby.  You may notice the pain when you suddenly change position, when you cough or sneeze, or during physical activity.  Relaxing, flexing your knees to your abdomen, lying on one side, or taking a warm bath may help to get rid of the pain.  Get help from your health care provider if the pain does not go away or if you have vaginal bleeding, nausea, vomiting, diarrhea, or painful urination. This information is not intended to replace advice given to you by your health care provider. Make sure you discuss any questions you have with your health care provider. Document Released: 01/25/2008 Document Revised: 10/03/2017 Document Reviewed: 10/03/2017 Elsevier Patient Education  2020 Elsevier Inc.  

## 2018-12-02 LAB — ALPHA FETOPROTEIN, MATERNAL
AFP MoM: 0.75
AFP, Serum: 42.9 ng/mL
Calc'd Gestational Age: 19.6 weeks
Maternal Wt: 132 [lb_av]
Risk for ONTD: <1
Twins-AFP: 1

## 2018-12-12 DIAGNOSIS — Z20828 Contact with and (suspected) exposure to other viral communicable diseases: Secondary | ICD-10-CM | POA: Diagnosis not present

## 2018-12-26 ENCOUNTER — Other Ambulatory Visit: Payer: Self-pay

## 2018-12-26 ENCOUNTER — Ambulatory Visit (HOSPITAL_COMMUNITY)
Admission: RE | Admit: 2018-12-26 | Discharge: 2018-12-26 | Disposition: A | Discharge status: Home / Self Care | Payer: BC Managed Care – PPO | Source: Ambulatory Visit | Attending: Obstetrics and Gynecology | Admitting: Obstetrics and Gynecology

## 2018-12-26 DIAGNOSIS — Z362 Encounter for other antenatal screening follow-up: Secondary | ICD-10-CM

## 2018-12-26 DIAGNOSIS — O09292 Supervision of pregnancy with other poor reproductive or obstetric history, second trimester: Secondary | ICD-10-CM | POA: Diagnosis not present

## 2018-12-26 DIAGNOSIS — Z3A23 23 weeks gestation of pregnancy: Secondary | ICD-10-CM

## 2019-01-02 ENCOUNTER — Encounter: Payer: Self-pay | Admitting: Obstetrics & Gynecology

## 2019-01-02 ENCOUNTER — Telehealth (INDEPENDENT_AMBULATORY_CARE_PROVIDER_SITE_OTHER): Payer: BC Managed Care – PPO | Admitting: Obstetrics & Gynecology

## 2019-01-02 ENCOUNTER — Other Ambulatory Visit: Payer: Self-pay

## 2019-01-02 DIAGNOSIS — O09899 Supervision of other high risk pregnancies, unspecified trimester: Secondary | ICD-10-CM

## 2019-01-02 DIAGNOSIS — Z283 Underimmunization status: Secondary | ICD-10-CM

## 2019-01-02 DIAGNOSIS — O9989 Other specified diseases and conditions complicating pregnancy, childbirth and the puerperium: Secondary | ICD-10-CM

## 2019-01-02 DIAGNOSIS — Z3480 Encounter for supervision of other normal pregnancy, unspecified trimester: Secondary | ICD-10-CM

## 2019-01-02 DIAGNOSIS — Z3A24 24 weeks gestation of pregnancy: Secondary | ICD-10-CM

## 2019-01-02 NOTE — Progress Notes (Signed)
TELEHEALTH OBSTETRICS PRENATAL VIRTUAL VIDEO VISIT ENCOUNTER NOTE  [facility] [place]   I connected with Rebecca StandsBrittany Perfect on 01/02/19 at  1:45 PM EDT by MyChart Video Encounter at home and verified that I am speaking with the correct person using two identifiers.   I discussed the limitations, risks, security and privacy concerns of performing an evaluation and management service virtually and the availability of in person appointments. I also discussed with the patient that there may be a patient responsible charge related to this service. The patient expressed understanding and agreed to proceed. Subjective:  Rebecca StandsBrittany Swier is a 28 y.o. 472P1001 (35 yo son) at 6676w2d being seen today for ongoing prenatal care.  She is currently monitored for the following issues for this low-risk pregnancy and has Supervision of other normal pregnancy, antepartum and Rubella non-immune status, antepartum on their problem list.  Patient reports no complaints.  Contractions: Irritability. Vag. Bleeding: None.  Movement: Present. Denies any leaking of fluid.   The following portions of the patient's history were reviewed and updated as appropriate: allergies, current medications, past family history, past medical history, past social history, past surgical history and problem list.   Objective:   Vitals:   01/02/19 1333  BP: 109/66  Weight: 141 lb (64 kg)    Fetal Status:     Movement: Present     General:  Alert, oriented and cooperative. Patient is in no acute distress.  Respiratory: Normal respiratory effort, no problems with respiration noted  Mental Status: Normal mood and affect. Normal behavior. Normal judgment and thought content.  Rest of physical exam deferred due to type of encounter  Imaging: [place] [facility] Ob Follow Up  Result Date: 12/27/2018 ----------------------------------------------------------------------  OBSTETRICS REPORT                        (Signed Final 12/27/2018 09:38 am) ---------------------------------------------------------------------- Patient Info  ID #:       [id number]                          D.O.B.:  [birth year] (28 yrs)  Name:       Rebecca Downs                   Visit Date: 12/26/2018 10:30 am ---------------------------------------------------------------------- Performed By  Performed By:     Lenise ArenaHannah Bazemore        Secondary Phy.:   Allie BossierMYRA C Nevea Spiewak MD                    RDMS  Attending:        Lin Landsmanorenthian Booker      Address:          [address]                                                             [address]  [place], [place]                                                             [address]  Referred By:      Everardo All       [facility]  Ref. Address:     [address], [place] ---------------------------------------------------------------------- Orders   #  Description                          Code         Ordered By   1  [place] MFM OB FOLLOW UP                  [phone]     Noralee Space  ----------------------------------------------------------------------   #  Order #                    Accession #                 Episode #   1  [id number]                  [id number]                  [id number]  ---------------------------------------------------------------------- Indications   Encounter for antenatal screening for          Z36.3   malformations   28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 Cord Insertion:      Previously Visualized  Amniotic Fluid  AFI FV:      Within normal limits                              Largest Pocket(cm)                              5.26 ---------------------------------------------------------------------- Biometry  BPD:  54.8  mm     G. Age:  22w 5d         23  %    CI:        73.68   %    70 - 86                                                          FL/HC:      19.0   %    19.2 - 20.8  HC:      202.8  mm     G. Age:  22w 3d          9  %    HC/AC:      1.11        1.05 - 1.21  AC:       182   mm     G. Age:  23w 0d         33  %    FL/BPD:     70.4   %    71 - 87  FL:       38.6  mm     G. Age:  22w 3d         14  %    FL/AC:      21.2   %    20 - 24  LV:          6  mm  Est. FW:     528  gm      1 lb 3 oz     19  % ---------------------------------------------------------------------- OB History  Gravidity:    2         Term:   1 ---------------------------------------------------------------------- Gestational Age  Clinical EDD:  23w 2d                                        EDD:   04/22/19  U/S Today:     22w 5d                                        EDD:   04/26/19  Best:          23w 2d     Det. By:  Clinical EDD             EDD:   04/22/19 ---------------------------------------------------------------------- Anatomy  Cranium:               Appears normal         Aortic Arch:            Previously seen  Cavum:                 Appears normal         Ductal Arch:            Appears normal  Ventricles:            Appears normal         Diaphragm:              Appears normal  Choroid Plexus:  Previously seen        Stomach:                Appears normal, left                                                                        sided  Cerebellum:            Previously seen        Abdomen:                Appears normal  Posterior  Fossa:       Previously seen        Abdominal Wall:         Appears nml (cord                                                                        insert, abd wall)  Nuchal Fold:           Previously seen        Cord Vessels:           Previously seen  Face:                  Appears normal         Kidneys:                Appear normal                         (orbits and profile)  Lips:                  Previously seen        Bladder:                Appears normal  Thoracic:              Appears normal         Spine:                  Previously seen  Heart:                 Appears normal         Upper Extremities:      Previously seen                         (4CH, axis, and                         situs)  RVOT:                  Appears normal         Lower Extremities:      Previously seen  LVOT:                  Appears normal  Other:  Fetus appears  to be female. ---------------------------------------------------------------------- Cervix Uterus Adnexa  Cervix  Length:           4.56  cm.  Normal appearance by transabdominal scan. ---------------------------------------------------------------------- Impression  Normal interval growth. ---------------------------------------------------------------------- Recommendations  Follow up growth as clincially indicated. ----------------------------------------------------------------------               Lin Landsmanorenthian Booker, MD Electronically Signed Final Report   12/27/2018 09:38 am ----------------------------------------------------------------------   Assessment and Plan:  Pregnancy: G2P1001 at 4138w2d 1. Rubella non-immune status, antepartum  2. Supervision of other normal pregnancy, antepartum  2 hour GTT, labs, vaccines at next visit  Preterm labor symptoms and general obstetric precautions including but not limited to vaginal bleeding, contractions, leaking of fluid and fetal movement were reviewed in detail with the patient. I discussed the assessment and  treatment plan with the patient. The patient was provided an opportunity to ask questions and all were answered. The patient agreed with the plan and demonstrated an understanding of the instructions. The patient was advised to call back or seek an in-person office evaluation/[facility] for any urgent or concerning symptoms. Please refer to After Visit Summary for other counseling recommendations.   I provided 10 minutes of face-to-face time during this encounter.  Return in about 3 weeks (around 01/23/2019) for 2 hour GTT, labs, vaccines in person.  No future appointments.  Allie BossierMyra C Sibel Khurana, MD [facility], [facility]

## 2019-01-02 NOTE — Progress Notes (Signed)
error 

## 2019-01-22 ENCOUNTER — Telehealth: Payer: Self-pay | Admitting: *Deleted

## 2019-01-22 NOTE — Telephone Encounter (Signed)
Left patient a message to call the office to answer screening questions prior to appointment on 01/24/2019 at 9:15am with a 9:00am arrival.

## 2019-01-24 ENCOUNTER — Ambulatory Visit (INDEPENDENT_AMBULATORY_CARE_PROVIDER_SITE_OTHER): Payer: BC Managed Care – PPO | Admitting: Obstetrics and Gynecology

## 2019-01-24 ENCOUNTER — Other Ambulatory Visit: Payer: Self-pay

## 2019-01-24 VITALS — BP 100/65 | HR 76 | Wt 141.0 lb

## 2019-01-24 DIAGNOSIS — O09292 Supervision of pregnancy with other poor reproductive or obstetric history, second trimester: Secondary | ICD-10-CM

## 2019-01-24 DIAGNOSIS — Z3480 Encounter for supervision of other normal pregnancy, unspecified trimester: Secondary | ICD-10-CM

## 2019-01-24 DIAGNOSIS — Z3A27 27 weeks gestation of pregnancy: Secondary | ICD-10-CM

## 2019-01-24 DIAGNOSIS — Z23 Encounter for immunization: Secondary | ICD-10-CM

## 2019-01-24 DIAGNOSIS — O09299 Supervision of pregnancy with other poor reproductive or obstetric history, unspecified trimester: Secondary | ICD-10-CM

## 2019-01-24 NOTE — Progress Notes (Signed)
   PRENATAL VISIT NOTE  Subjective:  Rebecca Downs is a 28 y.o. G2P1001 at 61w3dbeing seen today for ongoing prenatal care.  She is currently monitored for the following issues for this low-risk pregnancy and has Supervision of other normal pregnancy, antepartum; Rubella non-immune status, antepartum; and Hx of preeclampsia, prior pregnancy, currently pregnant on their problem list.  Patient reports no complaints.  Contractions: Irritability. Vag. Bleeding: None.  Movement: Present. Denies leaking of fluid.   The following portions of the patient's history were reviewed and updated as appropriate: allergies, current medications, past family history, past medical history, past social history, past surgical history and problem list.   Objective:   Vitals:   01/24/19 0932  BP: 100/65  Pulse: 76  Weight: 141 lb (64 kg)    Fetal Status: Fetal Heart Rate (bpm): 143   Movement: Present     General:  Alert, oriented and cooperative. Patient is in no acute distress.  Skin: Skin is warm and dry. No rash noted.   Cardiovascular: Normal heart rate noted  Respiratory: Normal respiratory effort, no problems with respiration noted  Abdomen: Soft, gravid, appropriate for gestational age.  Pain/Pressure: Present     Pelvic: Cervical exam deferred        Extremities: Normal range of motion.  Edema: Trace  Mental Status: Normal mood and affect. Normal behavior. Normal judgment and thought content.   Assessment and Plan:    1. Supervision of other normal pregnancy, antepartum  - 2Hr GTT w/ 1 Hr Carpenter 75 g - HIV antibody (with reflex) - CBC - RPR  2. Hx of preeclampsia, prior pregnancy, currently pregnant  - Comp Met (CMET) - Protein / creatinine ratio, urine - BP good today - Not on BASA     Preterm labor symptoms and general obstetric precautions including but not limited to vaginal bleeding, contractions, leaking of fluid and fetal movement were reviewed in detail with the  patient. Please refer to After Visit Summary for other counseling recommendations.   No follow-ups on file.  No future appointments.  JNoni Saupe NP

## 2019-01-25 LAB — COMPREHENSIVE METABOLIC PANEL
AG Ratio: 1.6 (calc) (ref 1.0–2.5)
ALT: 9 U/L (ref 6–29)
AST: 18 U/L (ref 10–30)
Albumin: 3.5 g/dL — ABNORMAL LOW (ref 3.6–5.1)
Alkaline phosphatase (APISO): 50 U/L (ref 31–125)
BUN/Creatinine Ratio: 12 (calc) (ref 6–22)
BUN: 6 mg/dL — ABNORMAL LOW (ref 7–25)
CO2: 21 mmol/L (ref 20–32)
Calcium: 8.7 mg/dL (ref 8.6–10.2)
Chloride: 106 mmol/L (ref 98–110)
Creat: 0.49 mg/dL — ABNORMAL LOW (ref 0.50–1.10)
Globulin: 2.2 g/dL (calc) (ref 1.9–3.7)
Glucose, Bld: 84 mg/dL (ref 65–99)
Potassium: 5.2 mmol/L (ref 3.5–5.3)
Sodium: 137 mmol/L (ref 135–146)
Total Bilirubin: 0.2 mg/dL (ref 0.2–1.2)
Total Protein: 5.7 g/dL — ABNORMAL LOW (ref 6.1–8.1)

## 2019-01-25 LAB — PROTEIN / CREATININE RATIO, URINE
Creatinine, Urine: 54 mg/dL (ref 20–275)
Protein/Creat Ratio: 111 mg/g creat (ref 21–161)
Protein/Creatinine Ratio: 0.111 mg/mg creat (ref 0.021–0.16)
Total Protein, Urine: 6 mg/dL (ref 5–24)

## 2019-01-27 LAB — CBC
HCT: 36.1 % (ref 35.0–45.0)
Hemoglobin: 12.2 g/dL (ref 11.7–15.5)
MCH: 30.7 pg (ref 27.0–33.0)
MCHC: 33.8 g/dL (ref 32.0–36.0)
MCV: 90.9 fL (ref 80.0–100.0)
MPV: 13.1 fL — ABNORMAL HIGH (ref 7.5–12.5)
Platelets: 191 10*3/uL (ref 140–400)
RBC: 3.97 10*6/uL (ref 3.80–5.10)
RDW: 12.3 % (ref 11.0–15.0)
WBC: 6.8 10*3/uL (ref 3.8–10.8)

## 2019-01-27 LAB — 2HR GTT W 1 HR, CARPENTER, 75 G
Glucose, 1 Hr, Gest: 96 mg/dL (ref 65–179)
Glucose, 2 Hr, Gest: 85 mg/dL (ref 65–152)
Glucose, Fasting, Gest: 76 mg/dL (ref 65–91)

## 2019-01-27 LAB — HIV ANTIBODY (ROUTINE TESTING W REFLEX): HIV 1&2 Ab, 4th Generation: NONREACTIVE

## 2019-01-27 LAB — RPR: RPR Ser Ql: NONREACTIVE

## 2019-02-07 ENCOUNTER — Telehealth (INDEPENDENT_AMBULATORY_CARE_PROVIDER_SITE_OTHER): Payer: BC Managed Care – PPO | Admitting: Advanced Practice Midwife

## 2019-02-07 ENCOUNTER — Encounter: Payer: Self-pay | Admitting: Advanced Practice Midwife

## 2019-02-07 DIAGNOSIS — Z283 Underimmunization status: Secondary | ICD-10-CM

## 2019-02-07 DIAGNOSIS — Z2839 Other underimmunization status: Secondary | ICD-10-CM

## 2019-02-07 DIAGNOSIS — O09299 Supervision of pregnancy with other poor reproductive or obstetric history, unspecified trimester: Secondary | ICD-10-CM

## 2019-02-07 DIAGNOSIS — Z3A29 29 weeks gestation of pregnancy: Secondary | ICD-10-CM

## 2019-02-07 DIAGNOSIS — O09293 Supervision of pregnancy with other poor reproductive or obstetric history, third trimester: Secondary | ICD-10-CM

## 2019-02-07 DIAGNOSIS — Z3480 Encounter for supervision of other normal pregnancy, unspecified trimester: Secondary | ICD-10-CM

## 2019-02-07 DIAGNOSIS — O99891 Other specified diseases and conditions complicating pregnancy: Secondary | ICD-10-CM

## 2019-02-07 NOTE — Progress Notes (Signed)
   TELEHEALTH VIRTUAL OBSTETRICS VISIT ENCOUNTER NOTE  I connected with Rebecca Downs on 02/07/19 at 10:45 AM EDT by telephone at home and verified that I am speaking with the correct person using two identifiers.   I discussed the limitations, risks, security and privacy concerns of performing an evaluation and management service by telephone and the availability of in person appointments. I also discussed with the patient that there may be a patient responsible charge related to this service. The patient expressed understanding and agreed to proceed.  Subjective:  Rebecca Downs is a 28 y.o. G2P1001 at 80w3dbeing followed for ongoing prenatal care.  She is currently monitored for the following issues for this high-risk pregnancy and has Supervision of other normal pregnancy, antepartum; Rubella non-immune status, antepartum; and Hx of preeclampsia, prior pregnancy, currently pregnant on their problem list.  Patient reports no complaints. Reports fetal movement. Denies any contractions, bleeding or leaking of fluid.   The following portions of the patient's history were reviewed and updated as appropriate: allergies, current medications, past family history, past medical history, past social history, past surgical history and problem list.   Objective:   General:  Alert, oriented and cooperative.   Mental Status: Normal mood and affect perceived. Normal judgment and thought content.  Rest of physical exam deferred due to type of encounter  Assessment and Plan:  Pregnancy: G2P1001 at 262w3d. Hx of preeclampsia, prior pregnancy, currently pregnant - BPs normal in baby scripts, encouraged patient to take BP once per week.   2. Rubella non-immune status, antepartum - MMR postpartum  3. Supervision of other normal pregnancy, antepartum -routine care  Preterm labor symptoms and general obstetric precautions including but not limited to vaginal bleeding, contractions, leaking of fluid and  fetal movement were reviewed in detail with the patient.  I discussed the assessment and treatment plan with the patient. The patient was provided an opportunity to ask questions and all were answered. The patient agreed with the plan and demonstrated an understanding of the instructions. The patient was advised to call back or seek an in-person office evaluation/[facility] any [facility] or concerning symptoms. Please refer to After Visit Summary for other counseling recommendations.   I provided 12 minutes of non-face-to-face time during this encounter.  Return in about 3 weeks (around 02/28/2019) for virtual visit .  Future Appointments  Date Time Provider [place]10/12/2018 10:45 AM [facility] MallCN[facility]    HeNortonCNM  02/07/19  10:43 AM  [facility] [place]

## 2019-02-28 ENCOUNTER — Telehealth (INDEPENDENT_AMBULATORY_CARE_PROVIDER_SITE_OTHER): Payer: BC Managed Care – PPO | Admitting: Obstetrics and Gynecology

## 2019-02-28 ENCOUNTER — Encounter: Payer: Self-pay | Admitting: Obstetrics and Gynecology

## 2019-02-28 DIAGNOSIS — Z3A32 32 weeks gestation of pregnancy: Secondary | ICD-10-CM

## 2019-02-28 DIAGNOSIS — O09899 Supervision of other high risk pregnancies, unspecified trimester: Secondary | ICD-10-CM

## 2019-02-28 DIAGNOSIS — Z3480 Encounter for supervision of other normal pregnancy, unspecified trimester: Secondary | ICD-10-CM

## 2019-02-28 DIAGNOSIS — O09299 Supervision of pregnancy with other poor reproductive or obstetric history, unspecified trimester: Secondary | ICD-10-CM

## 2019-02-28 DIAGNOSIS — Z283 Underimmunization status: Secondary | ICD-10-CM

## 2019-02-28 DIAGNOSIS — O09293 Supervision of pregnancy with other poor reproductive or obstetric history, third trimester: Secondary | ICD-10-CM

## 2019-02-28 NOTE — Progress Notes (Signed)
   TELEHEALTH VIRTUAL OBSTETRICS VISIT ENCOUNTER NOTE  I connected with [name] on 02/28/19 at  9:00 AM EDT by telephone at home and verified that I am speaking with the correct person using two identifiers.   I discussed the limitations, risks, security and privacy concerns of performing an evaluation and management service by telephone and the availability of in person appointments. I also discussed with the patient that there may be a patient responsible charge related to this service. The patient expressed understanding and agreed to proceed.  Subjective:  Rebecca Downs is a 28 y.o. G2P1001 at 41w3dbeing followed for ongoing prenatal care.  She is currently monitored for the following issues for this low-risk pregnancy and has Supervision of other normal pregnancy, antepartum; Rubella non-immune status, antepartum; and Hx of preeclampsia, prior pregnancy, currently pregnant on their problem list.  Patient reports no complaints. Reports fetal movement. Denies any contractions, bleeding or leaking of fluid.   The following portions of the patient's history were reviewed and updated as appropriate: allergies, current medications, past family history, past medical history, past social history, past surgical history and problem list.   Objective:   General:  Alert, oriented and cooperative.   Mental Status: Normal mood and affect perceived. Normal judgment and thought content.  Rest of physical exam deferred due to type of encounter  Assessment and Plan:  Pregnancy: G2P1001 at 376w3d. Hx of preeclampsia, prior pregnancy, currently pregnant  BP 113/75 Not taking ASA   2. Rubella non-immune status, antepartum  MMR PP  3. Supervision of other normal pregnancy, antepartum  Doing well   Preterm labor symptoms and general obstetric precautions including but not limited to vaginal bleeding, contractions, leaking of fluid and fetal movement were reviewed in detail with the patient.   I discussed the assessment and treatment plan with the patient. The patient was provided an opportunity to ask questions and all were answered. The patient agreed with the plan and demonstrated an understanding of the instructions. The patient was advised to call back or seek an in-person office evaluation/[facility] any urgent or concerning symptoms. Please refer to After Visit Summary for other counseling recommendations.   I provided 11 minutes of non-face-to-face time during this encounter.  Return in about 2 weeks (around 03/14/2019), or For virtual visit.  No future appointments.  JeNoni SaupeNP [facility]

## 2019-02-28 NOTE — Patient Instructions (Signed)
Cervical Ripening: May try one or both  Red Raspberry Leaf capsules:  two 300mg or 400mg tablets with each meal, 2-3 times a day  Potential Side Effects Of Raspberry Leaf:  Most women do not experience any side effects from drinking raspberry leaf tea. However, nausea and loose stools are possible     Evening Primrose Oil capsules: may take 1 to 3 capsules daily. May also prick one to release the oil and insert it into your vagina at night.  Some of the potential side effects:  Upset stomach  Loose stools or diarrhea  Headaches  Nausea:     The MilesCircuit  This circuit takes at least 90 minutes to complete so clear your schedule and make mental preparations so you can relax in your environment. The second step requires a lot of pillows so gather them up before beginning Before starting, you should empty your bladder! Have a nice drink nearby, and make sure it has a straw! If you are having contractions, this circuit should be done through contractions, try not to change positions between steps Before you begin...  "I named this 'circuit' after my friend Megan Miles, who shared and discussed it with me when I was working with a client whose labor seemed to be stalled out and no longer progressing... This circuit is useful to help get the baby lined up, ideally, in the "Left Occiput Anterior" (LOA) Position, both before labor begins and when some corrections need to be done during labor. Prenatally, this position set can help to rotate a baby. As a natural method of induction, this can help get things going if baby just needed a gentle nudge of position to set things off. To the best of my knowledge, this group of positions will not "hurt" a baby that is already lined up correctly." - Sharon Muza   Step One: Open-knee Chest Stay in this position for 30 minutes, start in cat/cow, then drop your chest as low as you can to the bed or the floor and your bottom as high as  you can. Knees should be fairly wide apart, and the angle between the torso/thighs should be wider than 90 degrees. Wiggle around, prop with lots of pillows and use this time to get totally relaxed. This position allows the baby to scoot out of the pelvis a bit and gives them room to rotate, shift their head position, etc. If the pregnant person finds it helpful, careful positioning with a rebozo under the belly, with gentle tension from a support person behind can help maintain this position for the full 30 minutes.  Step Two:Exaggerated Left Side Lying Roll to your left side, bringing your top leg as high as possible and keeping your bottom leg straight. Roll forward as much as possible, again using a lot of pillows. Sink into the bed and relax some more. If you fall asleep, that's totally okay and you can stay there! If not, stay here for at least another half an hour. Try and get your top right leg up towards your head and get as rolled over onto your belly as much as possible. If you repeat the circuit during labor, try alternating left and right sides. We know the photo the left is actually right side... just flip the image in your head.  Step Three: Moving and Lunges Lunge, walk stairs facing sideways, 2 at a time, (have a spotter downstairs of you!), take a walk outside with one foot on the curb and the   other on the street, sit on a birth ball and hula- anything that's upright and putting your pelvis in open, asymmetrical positions. Spend at least 30 minutes doing this one as well to give your baby a chance to move down. If you are lunging or stair or curb walking, you should lunge/walk/go up stairs in the direction that feels better to you. The key with the lunge is that the toes of the higher leg and mom's belly button should be at right angles. Do not lunge over your knee, that closes the pelvis.     Megan Hamilton Miles: Circuit Creator -  [email or web address] Sharon Muza, CD, BDT (DONA), LCCE, FACCE: Supporting Content - [email or web address] Emily Weaver Brown: Photography - [email or web address] Kate Dewey CD/CDT (BAI): Print and Webmaster - [email or web address] MilesCircuit Master[employer] The Miles Circuit [email or web address] 

## 2019-03-14 ENCOUNTER — Telehealth (INDEPENDENT_AMBULATORY_CARE_PROVIDER_SITE_OTHER): Payer: BC Managed Care – PPO | Admitting: Obstetrics & Gynecology

## 2019-03-14 DIAGNOSIS — O99893 Other specified diseases and conditions complicating puerperium: Secondary | ICD-10-CM

## 2019-03-14 DIAGNOSIS — Z3A34 34 weeks gestation of pregnancy: Secondary | ICD-10-CM

## 2019-03-14 DIAGNOSIS — Z3480 Encounter for supervision of other normal pregnancy, unspecified trimester: Secondary | ICD-10-CM

## 2019-03-14 DIAGNOSIS — O09899 Supervision of other high risk pregnancies, unspecified trimester: Secondary | ICD-10-CM

## 2019-03-14 DIAGNOSIS — Z283 Underimmunization status: Secondary | ICD-10-CM

## 2019-03-14 DIAGNOSIS — O09293 Supervision of pregnancy with other poor reproductive or obstetric history, third trimester: Secondary | ICD-10-CM

## 2019-03-14 DIAGNOSIS — O09299 Supervision of pregnancy with other poor reproductive or obstetric history, unspecified trimester: Secondary | ICD-10-CM

## 2019-03-14 NOTE — Progress Notes (Signed)
   TELEHEALTH OBSTETRICS PRENATAL VIRTUAL VIDEO VISIT ENCOUNTER NOTE  Provider location: [facility] at [place]   I connected with Rebecca Downs on 03/14/19 at  9:15 AM EST by MyChart Video Encounter at home and verified that I am speaking with the correct person using two identifiers.   I discussed the limitations, risks, security and privacy concerns of performing an evaluation and management service virtually and the availability of in person appointments. I also discussed with the patient that there may be a patient responsible charge related to this service. The patient expressed understanding and agreed to proceed. Subjective:  Rebecca Downs is a 28 y.o. G2P1001 at [redacted]w[redacted]d being seen today for ongoing prenatal care.  She is currently monitored for the following issues for this high-risk pregnancy and has Supervision of other normal pregnancy, antepartum; Rubella non-immune status, antepartum; and Hx of preeclampsia, prior pregnancy, currently pregnant on their problem list.  Patient reports no complaints.  Contractions: Irritability. Vag. Bleeding: None.  Movement: Present. Denies any leaking of fluid.   The following portions of the patient's history were reviewed and updated as appropriate: allergies, current medications, past family history, past medical history, past social history, past surgical history and problem list.   Objective:   Vitals:   03/14/19 0849  BP: 114/74  Weight: 158 lb (71.7 kg)    Fetal Status:     Movement: Present     General:  Alert, oriented and cooperative. Patient is in no acute distress.  Respiratory: Normal respiratory effort, no problems with respiration noted  Mental Status: Normal mood and affect. Normal behavior. Normal judgment and thought content.  Rest of physical exam deferred due to type of encounter  Imaging: No results found.  Assessment and Plan:  Pregnancy: G2P1001 at [redacted]w[redacted]d 1. Hx of preeclampsia, prior  pregnancy, currently pregnant - amazing BP, not taking baby ASA  2. Rubella non-immune status, antepartum - immunization after delivery  3. Supervision of other normal pregnancy, antepartum - GBS at next visit  Preterm labor symptoms and general obstetric precautions including but not limited to vaginal bleeding, contractions, leaking of fluid and fetal movement were reviewed in detail with the patient. I discussed the assessment and treatment plan with the patient. The patient was provided an opportunity to ask questions and all were answered. The patient agreed with the plan and demonstrated an understanding of the instructions. The patient was advised to call back or seek an in-person office evaluation/go to [facility]U at [facility] for any urgent or concerning symptoms. Please refer to After Visit Summary for other counseling recommendations.   I provided 10 minutes of face-to-face time during this encounter.  No follow-ups on file.  Future Appointments  Date Time Provider [place]  03/14/2019  9:15 AM Emily Filbert, MD [place], [place] for [facility], [place]

## 2019-03-25 ENCOUNTER — Ambulatory Visit (INDEPENDENT_AMBULATORY_CARE_PROVIDER_SITE_OTHER): Payer: BC Managed Care – PPO | Admitting: Certified Nurse Midwife

## 2019-03-25 ENCOUNTER — Encounter: Payer: Self-pay | Admitting: Certified Nurse Midwife

## 2019-03-25 ENCOUNTER — Other Ambulatory Visit: Payer: Self-pay

## 2019-03-25 VITALS — BP 120/65 | HR 97 | Temp 98.2°F | Wt 160.0 lb

## 2019-03-25 DIAGNOSIS — Z3483 Encounter for supervision of other normal pregnancy, third trimester: Secondary | ICD-10-CM

## 2019-03-25 DIAGNOSIS — Z3A36 36 weeks gestation of pregnancy: Secondary | ICD-10-CM

## 2019-03-25 DIAGNOSIS — Z3480 Encounter for supervision of other normal pregnancy, unspecified trimester: Secondary | ICD-10-CM

## 2019-03-25 DIAGNOSIS — Z113 Encounter for screening for infections with a predominantly sexual mode of transmission: Secondary | ICD-10-CM | POA: Diagnosis not present

## 2019-03-25 NOTE — Progress Notes (Addendum)
   PRENATAL VISIT NOTE  Subjective:  Rebecca Downs is a 28 y.o. G2P1001 at [redacted]w[redacted]d being seen today for ongoing prenatal care.  She is currently monitored for the following issues for this low-risk pregnancy and has Supervision of other normal pregnancy, antepartum; Rubella non-immune status, antepartum; and Hx of preeclampsia, prior pregnancy, currently pregnant on their problem list.  Patient reports no complaints.  Contractions: Irritability. Vag. Bleeding: None.  Movement: Present. Denies leaking of fluid.   The following portions of the patient's history were reviewed and updated as appropriate: allergies, current medications, past family history, past medical history, past social history, past surgical history and problem list.   Objective:   Vitals:   03/25/19 1355  BP: 120/65  Pulse: 97  Temp: 98.2 F (36.8 C)  Weight: 160 lb (72.6 kg)    Fetal Status: Fetal Heart Rate (bpm): 150 Fundal Height: 34 cm Movement: Present  Presentation: Vertex  General:  Alert, oriented and cooperative. Patient is in no acute distress.  Skin: Skin is warm and dry. No rash noted.   Cardiovascular: Normal heart rate noted  Respiratory: Normal respiratory effort, no problems with respiration noted  Abdomen: Soft, gravid, appropriate for gestational age.  Pain/Pressure: Present     Pelvic: Cervical exam performed Dilation: 1.5 Effacement (%): Thick Station: Ballotable  Extremities: Normal range of motion.  Edema: Trace  Mental Status: Normal mood and affect. Normal behavior. Normal judgment and thought content.   Assessment and Plan:  Pregnancy: G2P1001 at [redacted]w[redacted]d 1. Supervision of other normal pregnancy, antepartum - patient doing well, no complaints - Routine prenatal care - Anticipatory guidance on upcoming appointments - Educated and discussed use of EPO, RRT, and IC to stimulate contractions - Reviewed safety, visitor policy, reassurance about COVID-19 for pregnancy at this time. Discussed  possible changes to visits, including televisits, that may occur due to COVID-19.  The office remains open if pt needs to be seen and MAU is open 24 hours/day for OB emergencies.   - Culture, beta strep (group b only) - Cervicovaginal ancillary only( [place])  Term labor symptoms and general obstetric precautions including but not limited to vaginal bleeding, contractions, leaking of fluid and fetal movement were reviewed in detail with the patient. Please refer to After Visit Summary for other counseling recommendations.   Return in about 2 weeks (around 04/08/2019) for ROB-mychart.  Future Appointments  Date Time Provider [place]  04/08/2019  2:00 PM Herby Abraham [facility]  04/15/2019  1:15 PM Leftwich-Kirby, Kathie Dike, [place], CNM

## 2019-03-25 NOTE — Patient Instructions (Signed)

## 2019-03-26 LAB — CERVICOVAGINAL ANCILLARY ONLY
Chlamydia: NEGATIVE
Comment: NEGATIVE
Comment: NORMAL
Neisseria Gonorrhea: NEGATIVE

## 2019-03-28 LAB — CULTURE, BETA STREP (GROUP B ONLY)
MICRO NUMBER:: 1135525
SPECIMEN QUALITY:: ADEQUATE

## 2019-04-08 ENCOUNTER — Telehealth (INDEPENDENT_AMBULATORY_CARE_PROVIDER_SITE_OTHER): Payer: BC Managed Care – PPO | Admitting: Certified Nurse Midwife

## 2019-04-08 ENCOUNTER — Encounter: Payer: Self-pay | Admitting: Certified Nurse Midwife

## 2019-04-08 DIAGNOSIS — Z283 Underimmunization status: Secondary | ICD-10-CM

## 2019-04-08 DIAGNOSIS — Z3480 Encounter for supervision of other normal pregnancy, unspecified trimester: Secondary | ICD-10-CM

## 2019-04-08 DIAGNOSIS — Z3A38 38 weeks gestation of pregnancy: Secondary | ICD-10-CM

## 2019-04-08 DIAGNOSIS — O09299 Supervision of pregnancy with other poor reproductive or obstetric history, unspecified trimester: Secondary | ICD-10-CM

## 2019-04-08 DIAGNOSIS — O09899 Supervision of other high risk pregnancies, unspecified trimester: Secondary | ICD-10-CM

## 2019-04-08 DIAGNOSIS — O09293 Supervision of pregnancy with other poor reproductive or obstetric history, third trimester: Secondary | ICD-10-CM

## 2019-04-08 DIAGNOSIS — O99891 Other specified diseases and conditions complicating pregnancy: Secondary | ICD-10-CM

## 2019-04-08 NOTE — Progress Notes (Signed)
   TELEHEALTH OBSTETRICS PRENATAL VIRTUAL VIDEO VISIT ENCOUNTER NOTE  Provider location: [facility] for [facility] at [place]   I connected with Rebecca Downs on 04/08/19 at  1:50 PM EST by MyChart Video Encounter at home and verified that I am speaking with the correct person using two identifiers.   I discussed the limitations, risks, security and privacy concerns of performing an evaluation and management service virtually and the availability of in person appointments. I also discussed with the patient that there may be a patient responsible charge related to this service. The patient expressed understanding and agreed to proceed. Subjective:  Rebecca Downs is a 28 y.o. G2P1001 at 18w0dbeing seen today for ongoing prenatal care.  She is currently monitored for the following issues for this low-risk pregnancy and has Supervision of other normal pregnancy, antepartum; Rubella non-immune status, antepartum; and Hx of preeclampsia, prior pregnancy, currently pregnant on their problem list.  Patient reports occasional contractions.  Contractions: Irregular. Vag. Bleeding: None.  Movement: Present. Denies any leaking of fluid.   The following portions of the patient's history were reviewed and updated as appropriate: allergies, current medications, past family history, past medical history, past social history, past surgical history and problem list.   Objective:   Vitals:   04/08/19 1344  BP: 117/74    Fetal Status:     Movement: Present     General:  Alert, oriented and cooperative. Patient is in no acute distress.  Respiratory: Normal respiratory effort, no problems with respiration noted  Mental Status: Normal mood and affect. Normal behavior. Normal judgment and thought content.  Rest of physical exam deferred due to type of encounter  Imaging: No results found.  Assessment and Plan:  Pregnancy: G2P1001 at 317w0d. Hx of preeclampsia, prior pregnancy, currently pregnant  - BP stable - Patient denies s/s of PEC at this time  - Patient reports have a HA with the weather changes over the past 1-2 weeks but HA resolves with increased hydration and without medication   2. Rubella non-immune status, antepartum - MMR PP   3. Supervision of other normal pregnancy, antepartum - Patient doing well - Routine prenatal care - Anticipatory guidance on upcoming appointments  - Educated and discussed option of membrane sweeping at next appointment  - Reviewed use of EPO, RRT, IC, walking, miles circuit, and nipple stimulation to initiate contractions    Term labor symptoms and general obstetric precautions including but not limited to vaginal bleeding, contractions, leaking of fluid and fetal movement were reviewed in detail with the patient. I discussed the assessment and treatment plan with the patient. The patient was provided an opportunity to ask questions and all were answered. The patient agreed with the plan and demonstrated an understanding of the instructions. The patient was advised to call back or seek an in-person office evaluation/[facility] or concerning symptoms. Please refer to After Visit Summary for other counseling recommendations.   I provided 11 minutes of face-to-face time during this encounter.   Future Appointments  Date Time Provider [place]12/15/2020  1:15 PM Leftwich-Kirby, LiKathie DikeCNM [facility]    VeLajean ManesCNFlorenceor [facility]

## 2019-04-14 ENCOUNTER — Other Ambulatory Visit: Payer: Self-pay

## 2019-04-14 ENCOUNTER — Encounter (HOSPITAL_COMMUNITY): Payer: Self-pay | Admitting: Obstetrics & Gynecology

## 2019-04-14 ENCOUNTER — Inpatient Hospital Stay (HOSPITAL_COMMUNITY)
Admission: AD | Admit: 2019-04-14 | Discharge: 2019-04-14 | Disposition: A | Discharge status: Home / Self Care | Payer: BC Managed Care – PPO | Attending: Obstetrics & Gynecology | Admitting: Obstetrics & Gynecology

## 2019-04-14 DIAGNOSIS — O479 False labor, unspecified: Secondary | ICD-10-CM

## 2019-04-14 DIAGNOSIS — Z3689 Encounter for other specified antenatal screening: Secondary | ICD-10-CM | POA: Diagnosis not present

## 2019-04-14 DIAGNOSIS — O471 False labor at or after 37 completed weeks of gestation: Secondary | ICD-10-CM | POA: Diagnosis not present

## 2019-04-14 DIAGNOSIS — O4703 False labor before 37 completed weeks of gestation, third trimester: Secondary | ICD-10-CM

## 2019-04-14 DIAGNOSIS — Z3A38 38 weeks gestation of pregnancy: Secondary | ICD-10-CM | POA: Diagnosis not present

## 2019-04-14 LAB — URINALYSIS, ROUTINE W REFLEX MICROSCOPIC
Bilirubin Urine: NEGATIVE
Glucose, UA: 150 mg/dL — AB
Hgb urine dipstick: NEGATIVE
Ketones, ur: NEGATIVE mg/dL
Leukocytes,Ua: NEGATIVE
Nitrite: NEGATIVE
Protein, ur: NEGATIVE mg/dL
Specific Gravity, Urine: 1.016 (ref 1.005–1.030)
pH: 8 (ref 5.0–8.0)

## 2019-04-14 NOTE — MAU Provider Note (Signed)
S:  Rebecca Downs is a 28 y.o. female G2P1001 @ [redacted]w[redacted]d here for an NST. States she called her OB office and reported a HA and smelling in her extremities. She was sent here for NST and BP check. She denies HA or swelling at this time. States this occurred over the weekend.   O:  GENERAL: Well-developed, well-nourished female in no acute distress.  LUNGS: Effort normal SKIN: Warm, dry and without erythema PSYCH: Normal mood and affect ABDOMEN: soft, non tender Pelvic:  Cervix: 2.5 cm, 50 %, -3. Membrane sweep today with some bloody show following exam.  Vitals:   04/14/19 1152 04/14/19 1208  BP:  125/70  Pulse:  (!) 110  Temp:  97.8 F (36.6 C)  Resp:  16  Height: 5\' 6"  (1.676 m)   Weight: 77.2 kg   SpO2:  100%  BMI (Calculated): 27.48      NST: reactive, baseline 125 bpm, moderate variability, 15x15 accels.   A:  1. NST (non-stress test) reactive   2. [redacted] weeks gestation of pregnancy   3. Braxton Hicks contractions     P:  Discharge home in stable condition Labor precautions Return to MAU if symptoms worsen Keep your appointment in the office.  Lezlie Lye, NP 04/16/2019 8:34 AM

## 2019-04-14 NOTE — MAU Note (Signed)
Pt is G2P1 at 38w6 who was sent for NST due to bilateral lower limb edema, blurred vision, and headache over the weekend. Hx of pre-e in last pregnancy. Also having contractions 5-10 mins apart. Denies LOF, VB. +FM.

## 2019-04-15 ENCOUNTER — Ambulatory Visit (INDEPENDENT_AMBULATORY_CARE_PROVIDER_SITE_OTHER): Payer: BC Managed Care – PPO | Admitting: Advanced Practice Midwife

## 2019-04-15 VITALS — BP 114/68 | HR 92 | Temp 98.3°F | Wt 168.0 lb

## 2019-04-15 DIAGNOSIS — Z3A39 39 weeks gestation of pregnancy: Secondary | ICD-10-CM

## 2019-04-15 DIAGNOSIS — Z3483 Encounter for supervision of other normal pregnancy, third trimester: Secondary | ICD-10-CM

## 2019-04-15 DIAGNOSIS — Z3480 Encounter for supervision of other normal pregnancy, unspecified trimester: Secondary | ICD-10-CM

## 2019-04-15 NOTE — Progress Notes (Signed)
   PRENATAL VISIT NOTE  Subjective:  Rebecca Downs is a 28 y.o. G2P1001 at [redacted]w[redacted]d being seen today for ongoing prenatal care.  She is currently monitored for the following issues for this low-risk pregnancy and has Supervision of other normal pregnancy, antepartum; Rubella non-immune status, antepartum; and Hx of preeclampsia, prior pregnancy, currently pregnant on their problem list.  Patient reports irregular contractions. Was evaluated in MAU yesterday for contractions. Was 2.5cm and had a membrane sweep. Had some red bleeding after cervical exam. Today blood is minimal and brown.  Contractions: Irritability. Vag. Bleeding: Small.  Movement: Present. Denies leaking of fluid.   The following portions of the patient's history were reviewed and updated as appropriate: allergies, current medications, past family history, past medical history, past social history, past surgical history and problem list.   Objective:   Vitals:   04/15/19 1306  BP: 114/68  Pulse: 92  Temp: 98.3 F (36.8 C)  Weight: 76.2 kg    Fetal Status: Fetal Heart Rate (bpm): 155 Fundal Height: 39 cm Movement: Present  Presentation: Vertex  General:  Alert, oriented and cooperative. Patient is in no acute distress.  Skin: Skin is warm and dry. No rash noted.   Cardiovascular: Normal heart rate noted  Respiratory: Normal respiratory effort, no problems with respiration noted  Abdomen: Soft, gravid, appropriate for gestational age.  Pain/Pressure: Present     Pelvic: Cervical exam performed Dilation: 3 Effacement (%): 50 Station: Ballotable  Extremities: Normal range of motion.  Edema: Mild pitting, slight indentation  Mental Status: Normal mood and affect. Normal behavior. Normal judgment and thought content.   Assessment and Plan:  Pregnancy: G2P1001 at [redacted]w[redacted]d 1. Supervision of other normal pregnancy, antepartum - Routine prenatal care - Patient doing well overall. Reports irregular contractions.  - Membrane sweep  today per patient request  - Patient may continue EPO, red raspberry tea, nipple stimulation  - Encouraged patient to try [employer] once she gets home from appointment - Strict labor precautions given - Follow up in 1 week if still pregnant for routine prenatal care and NST.    Term labor symptoms and general obstetric precautions including but not limited to vaginal bleeding, contractions, leaking of fluid and fetal movement were reviewed in detail with the patient. Please refer to After Visit Summary for other counseling recommendations.    Maryagnes Amos, SNM

## 2019-04-22 ENCOUNTER — Ambulatory Visit (INDEPENDENT_AMBULATORY_CARE_PROVIDER_SITE_OTHER): Payer: BC Managed Care – PPO | Admitting: Certified Nurse Midwife

## 2019-04-22 ENCOUNTER — Encounter: Payer: Self-pay | Admitting: Certified Nurse Midwife

## 2019-04-22 ENCOUNTER — Other Ambulatory Visit: Payer: Self-pay

## 2019-04-22 VITALS — BP 111/68 | HR 89 | Wt 172.0 lb

## 2019-04-22 DIAGNOSIS — O99891 Other specified diseases and conditions complicating pregnancy: Secondary | ICD-10-CM

## 2019-04-22 DIAGNOSIS — Z3A40 40 weeks gestation of pregnancy: Secondary | ICD-10-CM

## 2019-04-22 DIAGNOSIS — Z3480 Encounter for supervision of other normal pregnancy, unspecified trimester: Secondary | ICD-10-CM

## 2019-04-22 DIAGNOSIS — Z2839 Other underimmunization status: Secondary | ICD-10-CM

## 2019-04-22 DIAGNOSIS — Z283 Underimmunization status: Secondary | ICD-10-CM

## 2019-04-22 NOTE — Progress Notes (Signed)
   PRENATAL VISIT NOTE  Subjective:  Rebecca Downs is a 28 y.o. G2P1001 at 75w0dbeing seen today for ongoing prenatal care.  She is currently monitored for the following issues for this low-risk pregnancy and has Supervision of other normal pregnancy, antepartum; Rubella non-immune status, antepartum; and Hx of preeclampsia, prior pregnancy, currently pregnant on their problem list.  Patient reports occasional contractions.  Contractions: Irritability. Vag. Bleeding: Small.  Movement: Present. Denies leaking of fluid.   The following portions of the patient's history were reviewed and updated as appropriate: allergies, current medications, past family history, past medical history, past social history, past surgical history and problem list.   Objective:   Vitals:   04/22/19 0939  BP: 111/68  Pulse: 89  Weight: 172 lb (78 kg)    Fetal Status: Fetal Heart Rate (bpm): NST   Movement: Present  Presentation: Vertex  General:  Alert, oriented and cooperative. Patient is in no acute distress.  Skin: Skin is warm and dry. No rash noted.   Cardiovascular: Normal heart rate noted  Respiratory: Normal respiratory effort, no problems with respiration noted  Abdomen: Soft, gravid, appropriate for gestational age.  Pain/Pressure: Present     Pelvic: Cervical exam performed Dilation: 4 Effacement (%): 50 Station: -3  Extremities: Normal range of motion.  Edema: Trace  Mental Status: Normal mood and affect. Normal behavior. Normal judgment and thought content.   Assessment and Plan:  Pregnancy: G2P1001 at 461w0d. Supervision of other normal pregnancy, antepartum - Patient doing well, ready to have a baby - reports occasional contractions  - NST completed today - reactive, baseline 135/moderate/+accels/ no decelerations - 1UC  - membrane sweep performed today in office  - IOL scheduled for 12/26- orders for admission placed   2. Rubella non-immune status, antepartum - MMR PP   Term labor  symptoms and general obstetric precautions including but not limited to vaginal bleeding, contractions, leaking of fluid and fetal movement were reviewed in detail with the patient. Please refer to After Visit Summary for other counseling recommendations.   Return in about 5 weeks (around 05/27/2019) for POSTPARTUM.  Future Appointments  Date Time Provider [place]12/24/2020  9:10 AM [facility]-SCREENING [facility] None  04/26/2019  7:00 AM [facility] None  06/03/2019 10:15 AM RoLajean ManesCNM [facility]    VeLajean ManesCNM

## 2019-04-22 NOTE — Patient Instructions (Signed)

## 2019-04-23 ENCOUNTER — Telehealth (HOSPITAL_COMMUNITY): Payer: Self-pay | Admitting: *Deleted

## 2019-04-23 NOTE — Telephone Encounter (Signed)
Preadmission screen  

## 2019-04-24 ENCOUNTER — Other Ambulatory Visit (HOSPITAL_COMMUNITY)
Admission: RE | Admit: 2019-04-24 | Discharge: 2019-04-24 | Disposition: A | Discharge status: Home / Self Care | Payer: BC Managed Care – PPO | Source: Ambulatory Visit | Attending: Family Medicine | Admitting: Family Medicine

## 2019-04-24 DIAGNOSIS — Z20828 Contact with and (suspected) exposure to other viral communicable diseases: Secondary | ICD-10-CM | POA: Insufficient documentation

## 2019-04-24 DIAGNOSIS — Z01812 Encounter for preprocedural laboratory examination: Secondary | ICD-10-CM | POA: Insufficient documentation

## 2019-04-24 LAB — SARS CORONAVIRUS 2 (TAT 6-24 HRS): SARS Coronavirus 2: NEGATIVE

## 2019-04-25 ENCOUNTER — Inpatient Hospital Stay (HOSPITAL_COMMUNITY)
Admission: AD | Admit: 2019-04-25 | Discharge: 2019-04-26 | DRG: 797 | Disposition: A | Discharge status: Home / Self Care | Payer: BC Managed Care – PPO | Attending: Family Medicine | Admitting: Family Medicine

## 2019-04-25 ENCOUNTER — Encounter (HOSPITAL_COMMUNITY)
Admission: AD | Disposition: A | Discharge status: Home / Self Care | Payer: Self-pay | Source: Home / Self Care | Attending: Family Medicine

## 2019-04-25 ENCOUNTER — Inpatient Hospital Stay (HOSPITAL_COMMUNITY): Payer: BC Managed Care – PPO | Admitting: Anesthesiology

## 2019-04-25 ENCOUNTER — Other Ambulatory Visit: Payer: Self-pay

## 2019-04-25 ENCOUNTER — Encounter (HOSPITAL_COMMUNITY): Payer: Self-pay | Admitting: Obstetrics & Gynecology

## 2019-04-25 DIAGNOSIS — O09299 Supervision of pregnancy with other poor reproductive or obstetric history, unspecified trimester: Secondary | ICD-10-CM

## 2019-04-25 DIAGNOSIS — Z23 Encounter for immunization: Secondary | ICD-10-CM

## 2019-04-25 DIAGNOSIS — F1290 Cannabis use, unspecified, uncomplicated: Secondary | ICD-10-CM | POA: Diagnosis not present

## 2019-04-25 DIAGNOSIS — Z2839 Other underimmunization status: Secondary | ICD-10-CM

## 2019-04-25 DIAGNOSIS — Z87891 Personal history of nicotine dependence: Secondary | ICD-10-CM

## 2019-04-25 DIAGNOSIS — Z3480 Encounter for supervision of other normal pregnancy, unspecified trimester: Secondary | ICD-10-CM

## 2019-04-25 DIAGNOSIS — Z302 Encounter for sterilization: Secondary | ICD-10-CM | POA: Diagnosis not present

## 2019-04-25 DIAGNOSIS — O480 Post-term pregnancy: Principal | ICD-10-CM | POA: Diagnosis present

## 2019-04-25 DIAGNOSIS — O99324 Drug use complicating childbirth: Secondary | ICD-10-CM | POA: Diagnosis present

## 2019-04-25 DIAGNOSIS — Z20828 Contact with and (suspected) exposure to other viral communicable diseases: Secondary | ICD-10-CM | POA: Diagnosis not present

## 2019-04-25 DIAGNOSIS — Z3A40 40 weeks gestation of pregnancy: Secondary | ICD-10-CM

## 2019-04-25 DIAGNOSIS — O09899 Supervision of other high risk pregnancies, unspecified trimester: Secondary | ICD-10-CM

## 2019-04-25 DIAGNOSIS — Z9851 Tubal ligation status: Secondary | ICD-10-CM

## 2019-04-25 DIAGNOSIS — Z283 Underimmunization status: Secondary | ICD-10-CM

## 2019-04-25 HISTORY — PX: TUBAL LIGATION: SHX77

## 2019-04-25 LAB — CBC
HCT: 35.4 % — ABNORMAL LOW (ref 36.0–46.0)
Hemoglobin: 12.1 g/dL (ref 12.0–15.0)
MCH: 30.7 pg (ref 26.0–34.0)
MCHC: 34.2 g/dL (ref 30.0–36.0)
MCV: 89.8 fL (ref 80.0–100.0)
Platelets: 163 10*3/uL (ref 150–400)
RBC: 3.94 MIL/uL (ref 3.87–5.11)
RDW: 13.1 % (ref 11.5–15.5)
WBC: 8.5 10*3/uL (ref 4.0–10.5)
nRBC: 0 % (ref 0.0–0.2)

## 2019-04-25 LAB — TYPE AND SCREEN
ABO/RH(D): B POS
Antibody Screen: NEGATIVE

## 2019-04-25 LAB — RPR: RPR Ser Ql: NONREACTIVE

## 2019-04-25 LAB — ABO/RH: ABO/RH(D): B POS

## 2019-04-25 SURGERY — LIGATION, FALLOPIAN TUBE, POSTPARTUM
Anesthesia: General | Laterality: Bilateral

## 2019-04-25 MED ORDER — DEXAMETHASONE SODIUM PHOSPHATE 4 MG/ML IJ SOLN
INTRAMUSCULAR | Status: DC | PRN
  Administered 2019-04-25: 8 mg via INTRAVENOUS

## 2019-04-25 MED ORDER — FENTANYL CITRATE (PF) 250 MCG/5ML IJ SOLN
INTRAMUSCULAR | Status: AC
  Filled 2019-04-25: qty 5

## 2019-04-25 MED ORDER — IBUPROFEN 600 MG PO TABS
600.0000 mg | ORAL_TABLET | Freq: Four times a day (QID) | ORAL | Status: DC
  Administered 2019-04-25 – 2019-04-26 (×3): 600 mg via ORAL
  Filled 2019-04-25 (×3): qty 1

## 2019-04-25 MED ORDER — OXYCODONE HCL 5 MG PO TABS: 10.0000 mg | ORAL_TABLET | ORAL | Status: DC | PRN

## 2019-04-25 MED ORDER — BUPIVACAINE HCL (PF) 0.25 % IJ SOLN
INTRAMUSCULAR | Status: DC | PRN
  Administered 2019-04-25: 7 mL

## 2019-04-25 MED ORDER — GLYCOPYRROLATE 0.2 MG/ML IJ SOLN
INTRAMUSCULAR | Status: DC | PRN
  Administered 2019-04-25: .1 mg via INTRAVENOUS

## 2019-04-25 MED ORDER — DIBUCAINE (PERIANAL) 1 % EX OINT: 1.0000 "application " | TOPICAL_OINTMENT | CUTANEOUS | Status: DC | PRN

## 2019-04-25 MED ORDER — FENTANYL CITRATE (PF) 100 MCG/2ML IJ SOLN
INTRAMUSCULAR | Status: AC
  Filled 2019-04-25: qty 2

## 2019-04-25 MED ORDER — SOD CITRATE-CITRIC ACID 500-334 MG/5ML PO SOLN: 30.0000 mL | ORAL | Status: DC | PRN

## 2019-04-25 MED ORDER — GLYCOPYRROLATE PF 0.2 MG/ML IJ SOSY
PREFILLED_SYRINGE | INTRAMUSCULAR | Status: AC
  Filled 2019-04-25: qty 1

## 2019-04-25 MED ORDER — ONDANSETRON HCL 4 MG/2ML IJ SOLN
INTRAMUSCULAR | Status: DC | PRN
  Administered 2019-04-25: 4 mg via INTRAVENOUS

## 2019-04-25 MED ORDER — OXYCODONE-ACETAMINOPHEN 5-325 MG PO TABS: 1.0000 | ORAL_TABLET | ORAL | Status: DC | PRN

## 2019-04-25 MED ORDER — BENZOCAINE-MENTHOL 20-0.5 % EX AERO
1.0000 "application " | INHALATION_SPRAY | CUTANEOUS | Status: DC | PRN
  Administered 2019-04-25: 1 via TOPICAL
  Filled 2019-04-25: qty 56

## 2019-04-25 MED ORDER — LACTATED RINGERS IV SOLN: 500.0000 mL | INTRAVENOUS | Status: DC | PRN

## 2019-04-25 MED ORDER — ACETAMINOPHEN 325 MG PO TABS: 650.0000 mg | ORAL_TABLET | ORAL | Status: DC | PRN

## 2019-04-25 MED ORDER — MAGNESIUM HYDROXIDE 400 MG/5ML PO SUSP: 30.0000 mL | ORAL | Status: DC | PRN

## 2019-04-25 MED ORDER — OXYCODONE HCL 5 MG PO TABS: 5.0000 mg | ORAL_TABLET | Freq: Once | ORAL | Status: DC | PRN

## 2019-04-25 MED ORDER — PROMETHAZINE HCL 25 MG/ML IJ SOLN: 6.2500 mg | INTRAMUSCULAR | Status: DC | PRN

## 2019-04-25 MED ORDER — ROCURONIUM BROMIDE 100 MG/10ML IV SOLN
INTRAVENOUS | Status: DC | PRN
  Administered 2019-04-25: 30 mg via INTRAVENOUS

## 2019-04-25 MED ORDER — MIDAZOLAM HCL 2 MG/2ML IJ SOLN
INTRAMUSCULAR | Status: AC
  Filled 2019-04-25: qty 2

## 2019-04-25 MED ORDER — INFLUENZA VAC SPLIT QUAD 0.5 ML IM SUSY
0.5000 mL | PREFILLED_SYRINGE | INTRAMUSCULAR | Status: AC
  Administered 2019-04-26: 0.5 mL via INTRAMUSCULAR
  Filled 2019-04-25: qty 0.5

## 2019-04-25 MED ORDER — OXYTOCIN 40 UNITS IN NORMAL SALINE INFUSION - SIMPLE MED: 2.5000 [IU]/h | INTRAVENOUS | Status: DC

## 2019-04-25 MED ORDER — DIPHENHYDRAMINE HCL 25 MG PO CAPS: 25.0000 mg | ORAL_CAPSULE | Freq: Four times a day (QID) | ORAL | Status: DC | PRN

## 2019-04-25 MED ORDER — OXYCODONE HCL 5 MG/5ML PO SOLN: 5.0000 mg | Freq: Once | ORAL | Status: DC | PRN

## 2019-04-25 MED ORDER — LIDOCAINE HCL (PF) 1 % IJ SOLN: 30.0000 mL | INTRAMUSCULAR | Status: DC | PRN

## 2019-04-25 MED ORDER — MEPERIDINE HCL 25 MG/ML IJ SOLN: 6.2500 mg | INTRAMUSCULAR | Status: DC | PRN

## 2019-04-25 MED ORDER — SENNOSIDES-DOCUSATE SODIUM 8.6-50 MG PO TABS
2.0000 | ORAL_TABLET | ORAL | Status: DC
  Administered 2019-04-25: 2 via ORAL
  Filled 2019-04-25: qty 2

## 2019-04-25 MED ORDER — COCONUT OIL OIL
1.0000 "application " | TOPICAL_OIL | Status: DC | PRN
  Administered 2019-04-26: 1 via TOPICAL

## 2019-04-25 MED ORDER — FENTANYL CITRATE (PF) 100 MCG/2ML IJ SOLN
INTRAMUSCULAR | Status: DC | PRN
  Administered 2019-04-25: 50 ug via INTRAVENOUS
  Administered 2019-04-25 (×2): 25 ug via INTRAVENOUS
  Administered 2019-04-25 (×2): 50 ug via INTRAVENOUS

## 2019-04-25 MED ORDER — FENTANYL CITRATE (PF) 100 MCG/2ML IJ SOLN
100.0000 ug | Freq: Once | INTRAMUSCULAR | Status: AC
  Administered 2019-04-25: 100 ug via INTRAVENOUS

## 2019-04-25 MED ORDER — HYDROMORPHONE HCL 1 MG/ML IJ SOLN
INTRAMUSCULAR | Status: AC
  Filled 2019-04-25: qty 0.5

## 2019-04-25 MED ORDER — LIDOCAINE HCL (CARDIAC) PF 100 MG/5ML IV SOSY
PREFILLED_SYRINGE | INTRAVENOUS | Status: DC | PRN
  Administered 2019-04-25: 80 mg via INTRAVENOUS

## 2019-04-25 MED ORDER — METHYLERGONOVINE MALEATE 0.2 MG PO TABS
0.2000 mg | ORAL_TABLET | Freq: Four times a day (QID) | ORAL | Status: DC
  Administered 2019-04-25: 0.2 mg via ORAL
  Filled 2019-04-25: qty 1

## 2019-04-25 MED ORDER — OXYTOCIN 40 UNITS IN NORMAL SALINE INFUSION - SIMPLE MED
1.0000 m[IU]/min | INTRAVENOUS | Status: DC
  Administered 2019-04-25: 2 m[IU]/min via INTRAVENOUS
  Filled 2019-04-25: qty 1000

## 2019-04-25 MED ORDER — OXYCODONE-ACETAMINOPHEN 5-325 MG PO TABS: 2.0000 | ORAL_TABLET | ORAL | Status: DC | PRN

## 2019-04-25 MED ORDER — SUGAMMADEX SODIUM 200 MG/2ML IV SOLN
INTRAVENOUS | Status: DC | PRN
  Administered 2019-04-25: 160 mg via INTRAVENOUS

## 2019-04-25 MED ORDER — MEASLES, MUMPS & RUBELLA VAC IJ SOLR
0.5000 mL | Freq: Once | INTRAMUSCULAR | Status: AC
  Administered 2019-04-26: 0.5 mL via SUBCUTANEOUS
  Filled 2019-04-25: qty 0.5

## 2019-04-25 MED ORDER — OXYTOCIN BOLUS FROM INFUSION
500.0000 mL | Freq: Once | INTRAVENOUS | Status: AC
  Administered 2019-04-25: 500 mL via INTRAVENOUS

## 2019-04-25 MED ORDER — PRENATAL MULTIVITAMIN CH
1.0000 | ORAL_TABLET | Freq: Every day | ORAL | Status: DC
  Administered 2019-04-26: 1 via ORAL
  Filled 2019-04-25: qty 1

## 2019-04-25 MED ORDER — KETOROLAC TROMETHAMINE 30 MG/ML IJ SOLN
30.0000 mg | Freq: Once | INTRAMUSCULAR | Status: AC | PRN
  Administered 2019-04-25: 30 mg via INTRAVENOUS

## 2019-04-25 MED ORDER — MIDAZOLAM HCL 2 MG/2ML IJ SOLN
INTRAMUSCULAR | Status: DC | PRN
  Administered 2019-04-25: 1 mg via INTRAVENOUS

## 2019-04-25 MED ORDER — SIMETHICONE 80 MG PO CHEW: 80.0000 mg | CHEWABLE_TABLET | ORAL | Status: DC | PRN

## 2019-04-25 MED ORDER — ACETAMINOPHEN 325 MG PO TABS
650.0000 mg | ORAL_TABLET | ORAL | Status: DC | PRN
  Administered 2019-04-25: 650 mg via ORAL
  Filled 2019-04-25: qty 2

## 2019-04-25 MED ORDER — SODIUM CHLORIDE 0.9 % IR SOLN
Status: DC | PRN
  Administered 2019-04-25: 1

## 2019-04-25 MED ORDER — WITCH HAZEL-GLYCERIN EX PADS
1.0000 "application " | MEDICATED_PAD | CUTANEOUS | Status: DC | PRN
  Administered 2019-04-25: 1 via TOPICAL

## 2019-04-25 MED ORDER — BUPIVACAINE HCL (PF) 0.25 % IJ SOLN
INTRAMUSCULAR | Status: AC
  Filled 2019-04-25: qty 30

## 2019-04-25 MED ORDER — BUTORPHANOL TARTRATE 1 MG/ML IJ SOLN
0.5000 mg | INTRAMUSCULAR | Status: DC | PRN
  Administered 2019-04-25 (×2): 0.5 mg via INTRAVENOUS
  Filled 2019-04-25 (×2): qty 1

## 2019-04-25 MED ORDER — KETOROLAC TROMETHAMINE 30 MG/ML IJ SOLN
INTRAMUSCULAR | Status: AC
  Filled 2019-04-25: qty 1

## 2019-04-25 MED ORDER — PROPOFOL 10 MG/ML IV BOLUS
INTRAVENOUS | Status: DC | PRN
  Administered 2019-04-25: 170 ug via INTRAVENOUS

## 2019-04-25 MED ORDER — LACTATED RINGERS IV SOLN: INTRAVENOUS | Status: DC

## 2019-04-25 MED ORDER — SUCCINYLCHOLINE CHLORIDE 200 MG/10ML IV SOSY
PREFILLED_SYRINGE | INTRAVENOUS | Status: DC | PRN
  Administered 2019-04-25: 120 mg via INTRAVENOUS

## 2019-04-25 MED ORDER — HYDROMORPHONE HCL 1 MG/ML IJ SOLN
0.2500 mg | INTRAMUSCULAR | Status: DC | PRN
  Administered 2019-04-25 (×2): 0.5 mg via INTRAVENOUS

## 2019-04-25 MED ORDER — TERBUTALINE SULFATE 1 MG/ML IJ SOLN: 0.2500 mg | Freq: Once | INTRAMUSCULAR | Status: DC | PRN

## 2019-04-25 MED ORDER — ONDANSETRON HCL 4 MG PO TABS: 4.0000 mg | ORAL_TABLET | ORAL | Status: DC | PRN

## 2019-04-25 MED ORDER — OXYCODONE HCL 5 MG PO TABS: 5.0000 mg | ORAL_TABLET | ORAL | Status: DC | PRN

## 2019-04-25 MED ORDER — ONDANSETRON HCL 4 MG/2ML IJ SOLN: 4.0000 mg | Freq: Four times a day (QID) | INTRAMUSCULAR | Status: DC | PRN

## 2019-04-25 MED ORDER — ONDANSETRON HCL 4 MG/2ML IJ SOLN: 4.0000 mg | INTRAMUSCULAR | Status: DC | PRN

## 2019-04-25 SURGICAL SUPPLY — 21 items
BLADE SURG 11 STRL SS (BLADE) ×3 IMPLANT
CLIP FILSHIE TUBAL LIGA STRL (Clip) ×3 IMPLANT
DRSG OPSITE POSTOP 3X4 (GAUZE/BANDAGES/DRESSINGS) ×3 IMPLANT
DURAPREP 26ML APPLICATOR (WOUND CARE) ×3 IMPLANT
GLOVE BIOGEL PI IND STRL 7.0 (GLOVE) ×1 IMPLANT
GLOVE BIOGEL PI IND STRL 7.5 (GLOVE) ×1 IMPLANT
GLOVE BIOGEL PI INDICATOR 7.0 (GLOVE) ×2
GLOVE BIOGEL PI INDICATOR 7.5 (GLOVE) ×2
GLOVE ECLIPSE 7.5 STRL STRAW (GLOVE) ×3 IMPLANT
GOWN STRL REUS W/TWL LRG LVL3 (GOWN DISPOSABLE) ×6 IMPLANT
HIBICLENS CHG 4% 4OZ BTL (MISCELLANEOUS) ×3 IMPLANT
NEEDLE HYPO 22GX1.5 SAFETY (NEEDLE) ×3 IMPLANT
NS IRRIG 1000ML POUR BTL (IV SOLUTION) ×3 IMPLANT
PACK ABDOMINAL MINOR (CUSTOM PROCEDURE TRAY) ×3 IMPLANT
PROTECTOR NERVE ULNAR (MISCELLANEOUS) ×3 IMPLANT
SPONGE LAP 4X18 RFD (DISPOSABLE) IMPLANT
SUT VICRYL 0 UR6 27IN ABS (SUTURE) ×3 IMPLANT
SUT VICRYL 4-0 PS2 18IN ABS (SUTURE) ×3 IMPLANT
SYR CONTROL 10ML LL (SYRINGE) ×3 IMPLANT
TOWEL OR 17X24 6PK STRL BLUE (TOWEL DISPOSABLE) ×6 IMPLANT
TRAY FOLEY W/BAG SLVR 14FR (SET/KITS/TRAYS/PACK) ×3 IMPLANT

## 2019-04-25 NOTE — Anesthesia Preprocedure Evaluation (Signed)
Anesthesia Evaluation  Patient identified by MRN, date of birth, ID band Patient awake    Reviewed: Allergy & Precautions, NPO status , Patient's Chart, lab work & pertinent test results  Airway Mallampati: II  TM Distance: >3 FB Neck ROM: Full    Dental no notable dental hx.    Pulmonary neg pulmonary ROS, former smoker,    Pulmonary exam normal breath sounds clear to auscultation       Cardiovascular negative cardio ROS Normal cardiovascular exam Rhythm:Regular Rate:Normal     Neuro/Psych Anxiety negative neurological ROS  negative psych ROS   GI/Hepatic negative GI ROS, Neg liver ROS,   Endo/Other  negative endocrine ROS  Renal/GU negative Renal ROS  negative genitourinary   Musculoskeletal negative musculoskeletal ROS (+)   Abdominal   Peds negative pediatric ROS (+)  Hematology negative hematology ROS (+)   Anesthesia Other Findings   Reproductive/Obstetrics negative OB ROS                             Anesthesia Physical Anesthesia Plan  ASA: II  Anesthesia Plan: General   Post-op Pain Management:    Induction: Intravenous, Rapid sequence and Cricoid pressure planned  PONV Risk Score and Plan: 3 and Ondansetron, Dexamethasone, Midazolam and Treatment may vary due to age or medical condition  Airway Management Planned: Oral ETT  Additional Equipment:   Intra-op Plan:   Post-operative Plan: Extubation in OR  Informed Consent: I have reviewed the patients History and Physical, chart, labs and discussed the procedure including the risks, benefits and alternatives for the proposed anesthesia with the patient or authorized representative who has indicated his/her understanding and acceptance.     Dental advisory given  Plan Discussed with: CRNA  Anesthesia Plan Comments:         Anesthesia Quick Evaluation

## 2019-04-25 NOTE — Anesthesia Postprocedure Evaluation (Signed)
Anesthesia Post Note  Patient: [name]  Procedure(s) Performed: POST PARTUM TUBAL LIGATION (Bilateral )     Patient location during evaluation: PACU Anesthesia Type: General Level of consciousness: awake and alert Pain management: pain level controlled Vital Signs Assessment: post-procedure vital signs reviewed and stable Respiratory status: spontaneous breathing, nonlabored ventilation and respiratory function stable Cardiovascular status: blood pressure returned to baseline and stable Postop Assessment: no apparent nausea or vomiting Anesthetic complications: no    Last Vitals:  Vitals:   04/25/19 1530 04/25/19 1553  BP: 133/84 128/84  Pulse: 90 94  Resp: 13 18  Temp:  (!) 36.4 C  SpO2:  98%    Last Pain:  Vitals:   04/25/19 1610  TempSrc:   PainSc: 3    Pain Goal: Patients Stated Pain Goal: 3 (04/25/19 1610)                 Lynda Rainwater

## 2019-04-25 NOTE — MAU Note (Signed)
Leaking fld since 2200. Fld started out clear but then became yellow/green.. Ctxs for an hour. 4cm last sve

## 2019-04-25 NOTE — H&P (Signed)
OBSTETRIC ADMISSION HISTORY AND PHYSICAL  Rebecca Downs is a 28 y.o. female G2P1001 with IUP at [redacted]w[redacted]d presenting for SOL with SROM at 2130 (was originally clear, but now reports green/yellow fluid). She reports +FMs. No LOF, VB, blurry vision, headaches, peripheral edema, or RUQ pain. She plans on breastfeeding. She requests interval BTL for birth control.  Dating: By Bedside [place] --->  Estimated Date of Delivery: 04/22/19  Sono:   @[redacted]w[redacted]d , normal anatomy, breech presentation, 528g, 19%ile, EFW 1#3  Prenatal History/Complications: H/o Pre-E with last pregnancy Rubella non-immune  Past Medical History: Past Medical History:  Diagnosis Date  . Abnormal Pap smear of cervix   . Anxiety     Past Surgical History: Past Surgical History:  Procedure Laterality Date  . WISDOM TOOTH EXTRACTION      Obstetrical History: OB History    Gravida  2   Para  1   Term  1   Preterm      AB      Living  1     SAB      TAB      Ectopic      Multiple      Live Births              Social History: Social History   Socioeconomic History  . Marital status: Single    Spouse name: Not on file  . Number of children: Not on file  . Years of education: Not on file  . Highest education level: Not on file  Occupational History    Employer: [employer]  Tobacco Use  . Smoking status: Former Smoker    Packs/day: 1.00    Years: 6.00    Pack years: 6.00    Types: Cigarettes  . Smokeless tobacco: Never Used  Substance and Sexual Activity  . Alcohol use: Not Currently    Alcohol/week: 0.0 standard drinks    Comment: occassional  . Drug use: Yes    Types: Marijuana    Comment: last smoked 10/23/2018  . Sexual activity: Yes    Partners: Male    Birth control/protection: None  Other Topics Concern  . Not on file  Social History Narrative  . Not on file   Social Determinants of Health   Financial Resource Strain:   . Difficulty of Paying Living Expenses: Not on file   Food Insecurity:   . Worried About Charity fundraiser in the Last Year: Not on file  . Ran Out of Food in the Last Year: Not on file  Transportation Needs:   . Lack of Transportation (Medical): Not on file  . Lack of Transportation (Non-Medical): Not on file  Physical Activity:   . Days of Exercise per Week: Not on file  . Minutes of Exercise per Session: Not on file  Stress:   . Feeling of Stress : Not on file  Social Connections:   . Frequency of Communication with Friends and Family: Not on file  . Frequency of Social Gatherings with Friends and Family: Not on file  . Attends Religious Services: Not on file  . Active Member of Clubs or Organizations: Not on file  . Attends Archivist Meetings: Not on file  . Marital Status: Not on file    Family History: Family History  Problem Relation Age of Onset  . Cancer - Colon Maternal Grandfather   . Heart disease Paternal Grandmother   . Hypertension Paternal Grandmother   . Heart disease Paternal Grandfather  bypass surgery  . Hypertension Paternal Grandfather   . Diabetes Paternal Grandfather     Allergies: No Known Allergies  Medications Prior to Admission  Medication Sig Dispense Refill Last Dose  . Prenatal Vit-Fe Fumarate-FA (PRENATAL VITAMINS PO) Take by mouth.   04/24/2019 at Unknown time  . Doxylamine-Pyridoxine 10-10 MG TBEC Take 1 tablet by mouth 4 (four) times daily. Take at hs, 1 q am and 1 at noon prn (Patient not taking: Reported on 04/22/2019) 100 tablet 3   . EVENING PRIMROSE OIL PO Take by mouth.     . nystatin (MYCOSTATIN/NYSTOP) powder Apply topically 4 (four) times daily. (Patient not taking: Reported on 01/02/2019) 45 g 1      Review of Systems:  All systems reviewed and negative except as stated in HPI  PE: Blood pressure 132/74, pulse (!) 108, temperature 97.7 F (36.5 C), resp. rate 18, height 5\' 6"  (1.676 m), weight 78.5 kg, last menstrual period 07/24/2018. General appearance:  alert and cooperative, uncomfortable with contractions Lungs: regular rate and effort Heart: regular rate  Abdomen: soft, non-tender Extremities: Homans sign is negative, no sign of DVT Presentation: cephalic EFM: 125 bpm, moderate variability, positive accels, no decels Toco: Ctx q56m Dilation: 4.5 Effacement (%): 60 Station: -2 Exam by:: [email or web address], RNC  Prenatal labs: ABO, Rh: B/RH(D) POSITIVE/-- (06/12 1006) Antibody: NO ANTIBODIES DETECTED (06/12 1006) Rubella: <0.90 (06/12 1006) RPR: NON-REACTIVE (09/25 0822)  HBsAg: NON-REACTIVE (06/12 1006)  HIV: NON-REACTIVE (09/25 0822)  GBS:   negative 2 hr GTT 76/96/85  Prenatal Transfer Tool  Maternal Diabetes: No Genetic Screening: Normal Maternal Ultrasounds/Referrals: Normal Fetal Ultrasounds or other Referrals:  None Maternal Substance Abuse:  No Significant Maternal Medications:  None Significant Maternal Lab Results: Group B Strep negative  Results for orders placed or performed during the hospital encounter of 04/24/19 (from the past 24 hour(s))  SARS CORONAVIRUS 2 (TAT 6-24 HRS) Nasopharyngeal Nasopharyngeal Swab   Collection Time: 04/24/19  9:19 AM   Specimen: Nasopharyngeal Swab  Result Value Ref Range   SARS Coronavirus 2 NEGATIVE NEGATIVE    Patient Active Problem List   Diagnosis Date Noted  . Hx of preeclampsia, prior pregnancy, currently pregnant 01/24/2019  . Rubella non-immune status, antepartum 10/15/2018  . Supervision of other normal pregnancy, antepartum 10/02/2018    Assessment: Rebecca Downs is a 28 y.o. G2P1001 at [redacted]w[redacted]d here for SOL with SROM 2130 on 12/24  1. Labor: expectant management. Consider augmentation with pitocin if appropriate. 2. FWB: Cat I tracing 3. Pain: IV pain medication per patient request. 4. GBS: Negative   Plan: Admit to labor and delivery with routine orders. Anticipate NSVD.  Christeen Lai L Amalio Loe, DO  04/25/2019, 3:54 AM

## 2019-04-25 NOTE — Op Note (Signed)
Rebecca Downs 04/25/2019  PREOPERATIVE DIAGNOSIS:  Undesired fertility  POSTOPERATIVE DIAGNOSIS:  Undesired fertility  PROCEDURE:  Postpartum Bilateral Tubal Sterilization using Filshie Clips   SURGEON:  Dr Loma Boston  ASSISTANT: Dr. Augustin Coupe  ANESTHESIA:  General  COMPLICATIONS:  None immediate.  ESTIMATED BLOOD LOSS:  Less than 20cc.  FLUIDS: 800 mL LR.  URINE OUTPUT:  400 mL of clear urine.  INDICATIONS: 28 y.o. yo [id number]  with undesired fertility,status post vaginal delivery, desires permanent sterilization. Risks and benefits of procedure discussed with patient including permanence of method, bleeding, infection, injury to surrounding organs and need for additional procedures. Risk failure of 0.5-1% with increased risk of ectopic gestation if pregnancy occurs was also discussed with patient.   FINDINGS:  Normal uterus, tubes, and ovaries.  TECHNIQUE:  The patient was taken to the operating room where her epidural anesthesia was dosed up to surgical level and found to be adequate.  She was then placed in the dorsal supine position and prepped and draped in sterile fashion.  After an adequate timeout was performed, attention was turned to the patient's abdomen where a small transverse skin incision was made under the umbilical fold. The incision was taken down to the layer of fascia using the scalpel, and fascia was incised, and extended bilaterally using Mayo scissors. The peritoneum was entered in a sharp fashion. Attention was then turned to the patient's uterus, and left fallopian tube was identified and followed out to the fimbriated end.    A Filshie clip was placed on the left fallopian tube about 2 cm from the cornual attachment, with care given to incorporate the underlying mesosalpinx.  A similar process was carried out on the rightl side allowing for bilateral tubal sterilization.    Good hemostasis was noted overall. The instruments were then removed from the  patient's abdomen and the fascial incision was repaired with 0 Vicryl, and the skin was closed with a 4-0 Vicryl subcuticular stitch. The patient tolerated the procedure well.  Sponge, lap, and needle counts were correct times two.  The patient was then taken to the recovery room awake, extubated and in stable condition.   Clarnce Flock, MD 04/25/2019

## 2019-04-25 NOTE — Lactation Note (Signed)
This note was copied from a baby's chart. Lactation Consultation Note  Patient Name: Rebecca Downs [id number] Date: 04/25/2019 Reason for consult: Initial assessment;Term  P2 mother whose infant is now 60 hours old.  Mother breast fed and did some pumping and bottle feeding for a total of 7 months with her first child (now 30 years old).  Mother has experienced mastitis with her first child.  Mother just finished feeding baby when I arrived.  She stated that this baby has latched and fed well since delivery; so much easier than her first child.  Her breasts are soft and non tender and nipples are everted and intact.  Mother has no concerns at this time.  She feels much more confident than the first time she breast fed.  Encouraged feeding 8-12 times/24 hours or sooner if baby shows feeding cues.  Mother is familiar with cues and hand expression and did not wish to review.  Colostrum container provided and milk storage times reviewed.  Encouraged to call for latch assistance as needed.  Mom made aware of O/P services, breastfeeding support groups, community resources, and our phone # for post-discharge questions. Mother has a DEBP for home use.  Father present.     Maternal Data Formula Feeding for Exclusion: No Has patient been taught Hand Expression?: Yes Does the patient have breastfeeding experience prior to this delivery?: Yes  Feeding Feeding Type: Breast Fed  LATCH Score Latch: Grasps breast easily, tongue down, lips flanged, rhythmical sucking.  Audible Swallowing: A few with stimulation  Type of Nipple: Everted at rest and after stimulation  Comfort (Breast/Nipple): Soft / non-tender  Hold (Positioning): Assistance needed to correctly position infant at breast and maintain latch.  LATCH Score: 8  Interventions    Lactation Tools Discussed/Used     Consult Status Consult Status: Follow-up Date: 04/26/19 Follow-up type: In-patient    Little Ishikawa 04/25/2019, 5:21 PM

## 2019-04-25 NOTE — Transfer of Care (Signed)
Immediate Anesthesia Transfer of Care Note  Patient: Rebecca Downs  Procedure(s) Performed: POST PARTUM TUBAL LIGATION (Bilateral )  Patient Location: PACU  Anesthesia Type:General  Level of Consciousness: awake, alert  and patient cooperative  Airway & Oxygen Therapy: Patient Spontanous Breathing  Post-op Assessment: Report given to RN and Post -op Vital signs reviewed and stable  Post vital signs: Reviewed and stable  Last Vitals:  Vitals Value Taken Time  BP 134/78 04/25/19 1441  Temp    Pulse 108 04/25/19 1443  Resp 19 04/25/19 1443  SpO2 100 % 04/25/19 1443  Vitals shown include unvalidated device data.  Last Pain:  Vitals:   04/25/19 1152  TempSrc: Oral  PainSc: 8       Patients Stated Pain Goal: 0 (29/52/84 1324)  Complications: No apparent anesthesia complications

## 2019-04-25 NOTE — Anesthesia Procedure Notes (Signed)
Procedure Name: Intubation Date/Time: 04/25/2019 2:55 PM Performed by: Georgeanne Nim, CRNA Pre-anesthesia Checklist: Patient identified, Emergency Drugs available, Suction available, Patient being monitored and Timeout performed Patient Re-evaluated:Patient Re-evaluated prior to induction Oxygen Delivery Method: Circle system utilized Preoxygenation: Pre-oxygenation with 100% oxygen Induction Type: IV induction, Rapid sequence and Cricoid Pressure applied Laryngoscope Size: Mac and 4 Grade View: Grade I Tube type: Oral Tube size: 7.0 mm Number of attempts: 1 Airway Equipment and Method: Stylet Placement Confirmation: ETT inserted through vocal cords under direct vision,  positive ETCO2,  CO2 detector and breath sounds checked- equal and bilateral Secured at: 20 cm Tube secured with: Tape Dental Injury: Teeth and Oropharynx as per pre-operative assessment

## 2019-04-25 NOTE — Progress Notes (Signed)
Patient ID: Rebecca Downs, female   DOB: 11/05/1990, 28 y.o.   MRN: [id number]  Risks of procedure discussed with patient including but not limited to: risk of regret, permanence of method, bleeding, infection, injury to surrounding organs and need for additional procedures.  Failure risk of 1 -2 % with increased risk of ectopic gestation if pregnancy occurs was also discussed with patient.    Truett Mainland, DO 04/25/2019 1:18 PM

## 2019-04-25 NOTE — Discharge Summary (Signed)
Postpartum Discharge Summary  Date of Service updated, yes     Patient Name: Rebecca Downs DOB: 07-Jan-1991 MRN: [id number]  Date of admission: 04/25/2019 Delivering Provider: Clarnce Flock   Date of discharge: 04/26/2019  Admitting diagnosis: Post-dates pregnancy [O48.0] Intrauterine pregnancy: [redacted]w[redacted]d    Secondary diagnosis:  Active Problems:   Supervision of other normal pregnancy, antepartum   Rubella non-immune status, antepartum   Hx of preeclampsia, prior pregnancy, currently pregnant   Post-dates pregnancy   H/O tubal ligation  Additional problems: none     Discharge diagnosis: Term Pregnancy Delivered                                                                                                Post partum procedures:postpartum tubal ligation  Augmentation: Pitocin  Complications: None  Hospital course:  Onset of Labor With Vaginal Delivery     28y.o. yo [id number] admitted in Latent Labor on 04/25/2019. Patient had an uncomplicated labor course as follows: arrived at 4cm and was augmented with pitocin. Membrane Rupture Time/Date: 9:30 PM ,04/24/2019   Intrapartum Procedures: Episiotomy: None [1]                                         Lacerations:  1st degree [2]  Patient had a delivery of a Viable infant. 04/25/2019  Information for the patient's newborn:  BuToya, [id number]]Delivery Method: Vag-Spont    After delivery patient had an uncomplicated BTL with Filshie clips under general anesthesia per her preference.   Pateint had an uncomplicated postpartum course.  She is ambulating, tolerating a regular diet, passing flatus, and urinating well. Patient is discharged home in stable condition on 04/26/19.  Delivery time: 11:30 AM    Magnesium Sulfate received: No BMZ received: No Rhophylac:N/A MMR:Yes Transfusion:No  Physical exam  Vitals:   04/25/19 2200 04/26/19 0200 04/26/19 0610 04/26/19 1015  BP: 115/63 108/60  117/84 114/61  Pulse: 94 88 80 94  Resp: _0 Temp: 98.6 F (37 C) 98.2 F (36.8 C) 98 F (36.7 C) 98.2 F (36.8 C)  TempSrc: Oral Oral Oral Oral  SpO2: 97% 98% 100%   Weight:      Height:       General: alert, cooperative and no distress Lochia: appropriate Uterine Fundus: firm Incision: Healing well with no significant drainage DVT Evaluation: No evidence of DVT seen on physical exam. Labs: Lab Results  Component Value Date   WBC 8.5 04/25/2019   HGB 12.1 04/25/2019   HCT 35.4 (L) 04/25/2019   MCV 89.8 04/25/2019   PLT 163 04/25/2019   CMP Latest Ref Rng & Units 01/24/2019  Glucose 65 - 99 mg/dL 84  BUN 7 - 25 mg/dL 6(L)  Creatinine 0.50 - 1.10 mg/dL 0.49(L)  Sodium 135 - 146 mmol/L 137  Potassium 3.5 - 5.3 mmol/L 5.2  Chloride 98 - 110 mmol/L 106  CO2 20 - 32 mmol/L 21  Calcium  8.6 - 10.2 mg/dL 8.7  Total Protein 6.1 - 8.1 g/dL 5.7(L)  Total Bilirubin 0.2 - 1.2 mg/dL 0.2  AST 10 - 30 U/L 18  ALT 6 - 29 U/L 9    Discharge instruction: per After Visit Summary and "Baby and Me Booklet".  After visit meds:  Allergies as of 04/26/2019   No Known Allergies     Medication List    STOP taking these medications   Doxylamine-Pyridoxine 10-10 MG Tbec   EVENING PRIMROSE OIL PO   nystatin powder Commonly known as: MYCOSTATIN/NYSTOP     TAKE these medications   docusate sodium 100 MG capsule Commonly known as: COLACE Take 1 capsule (100 mg total) by mouth 2 (two) times daily.   ibuprofen 600 MG tablet Commonly known as: ADVIL Take 1 tablet (600 mg total) by mouth every 6 (six) hours.   oxyCODONE 5 MG immediate release tablet Commonly known as: Oxy IR/ROXICODONE Take 1 tablet (5 mg total) by mouth every 6 (six) hours as needed for up to 3 days for breakthrough pain (pain scale 4-7).   PRENATAL VITAMINS PO Take by mouth.       Diet: routine diet  Activity: Advance as tolerated. Pelvic rest for 6 weeks.   Outpatient follow up:6 weeks Follow  up Appt: Future Appointments  Date Time Provider [place]  06/03/2019 10:15 AM Lajean Manes, CNM [facility]   Follow up Visit: Follow-up Information    [facility] Follow up.   Specialty: Obstetrics and Gynecology Why: present for postpartum related complications Contact information: [address] [id number] [place] [phone]          Please schedule this patient for Postpartum visit in: 6 weeks with the following provider: Any provider For C/S patients schedule nurse incision check in weeks 2 weeks: no Low risk pregnancy complicated by: n/a Delivery mode:  SVD Anticipated Birth Control:  BTL done PP PP Procedures needed: none  Schedule Integrated BH visit: no    Newborn Data: Live born female  Birth Weight: 8 lb 1.8 oz (3680 g) APGAR: 9, 9  Newborn Delivery   Birth date/time: 04/[birth year]/2019 11:30:00 Delivery type: Vaginal, Spontaneous      Baby Feeding: Breast Disposition:home with mother   04/26/2019 Jorje Guild, NP

## 2019-04-26 ENCOUNTER — Inpatient Hospital Stay (HOSPITAL_COMMUNITY)
Admission: AD | Admit: 2019-04-26 | Payer: BC Managed Care – PPO | Source: Home / Self Care | Admitting: Obstetrics and Gynecology

## 2019-04-26 ENCOUNTER — Inpatient Hospital Stay (HOSPITAL_COMMUNITY): Payer: BC Managed Care – PPO

## 2019-04-26 ENCOUNTER — Encounter: Payer: Self-pay | Admitting: *Deleted

## 2019-04-26 MED ORDER — IBUPROFEN 600 MG PO TABS: 600.0000 mg | ORAL_TABLET | Freq: Four times a day (QID) | ORAL | 0 refills | Status: AC

## 2019-04-26 MED ORDER — DOCUSATE SODIUM 100 MG PO CAPS: 100.0000 mg | ORAL_CAPSULE | Freq: Two times a day (BID) | ORAL | 0 refills | Status: AC

## 2019-04-26 MED ORDER — OXYCODONE HCL 5 MG PO TABS: 5.0000 mg | ORAL_TABLET | Freq: Four times a day (QID) | ORAL | 0 refills | Status: AC | PRN

## 2019-04-26 NOTE — Clinical Social Work Maternal (Signed)
CLINICAL SOCIAL WORK MATERNAL/CHILD NOTE  Patient Details  Name: Rebecca Downs MRN: [id number] Date of Birth: 04/25/2021  Date:  04/26/2019  Clinical Social Worker Initiating Note:  Edwin Dada, MSW, LCSW-A Date/Time: Initiated:  04/26/19/1300     Child's Name:  Rebecca Downs   Biological Parents:  Mother, Father   Need for Interpreter:  None   Reason for Referral:  Current Substance Use/Substance Use During Pregnancy    Address:  [address] [place] [address]    Phone number:  [phone] (home)     Additional phone number:   Household Members/Support Persons (HM/SP):   Household Member/Support Person 1   HM/SP Name Relationship DOB or Age  HM/SP -1 Bearl [place] FOB    HM/SP -2        HM/SP -3        HM/SP -4        HM/SP -5        HM/SP -6        HM/SP -7        HM/SP -8          Natural Supports (not living in the home):  Friends, Immediate Family, Extended Family   Professional Supports: None   Employment: Unemployed   Type of Work:     Education:  Engineer, agricultural   Homebound arranged:    Surveyor, quantity Resources:  Media planner   Other Resources:      Cultural/Religious Considerations Which May Impact Care:    Strengths:  Home prepared for child , Ability to meet basic needs , Pediatrician chosen   Psychotropic Medications:         Pediatrician:    [facility] [place])  Pediatrician List:   [facility] [place]    [facility]    Pediatrician Fax Number:    Risk Factors/Current Problems:  None   Cognitive State:  Alert    Mood/Affect:  Interested , Comfortable , Calm , Happy    CSW Assessment: CSW received consult for MOB due to history of substance use. CSW spoke with MOB to complete discussion and assessment. MOB confirmed her history of marijuana use, stating her last use was in  early June 2020. CSW explained hospital drug screening policies to MOB, she stated understanding and did not have questions or concerns. CSW informed MOB that the newborn's UDS was negative, but cord was still waiting for results. MOB reports that this is her second child, the oldest being age 30. MOB reports FOB is involved and supportive, his name is Samuel [place]. MOB reports naming this baby Bindi. MOB reports she has a history of undiagnosed generalized anxiety. MOB states the anxiety is very mild, is situational, and she is able to manage without medications or therapy. MOB reports she has a car seat for safe transportation of infant with knowledge of installation and use. MOB reports the infant will sleep in a bassinet at home. SIDS precautions reviewed for safe sleeping. MOB reports she has a great support system outside of the hospital including FOB, family, and friends. MOB reports she is currently breast feeding and intends to do that until further notice. MOB reports she has chosen [facility] in [place] for pediatric care. MOB denies any concerns at this time and stated readiness for discharge.  CSW will continue monitoring chart for cord results and will make CPS report  if warranted.  CSW Plan/Description:  CSW Will Continue to Monitor Umbilical Cord Tissue Drug Screen Results and Make Report if Earlie Counts, LCSW 04/26/2019, 1:04 PM

## 2019-04-26 NOTE — Discharge Instructions (Signed)
Postpartum Tubal Ligation, Care After This sheet gives you information about how to care for yourself after your procedure. Your health care provider may also give you more specific instructions. If you have problems or questions, contact your health care provider. What can I expect after the procedure? After the procedure, you may have:  A sore throat.  Bruising or pain in your back.  Nausea or vomiting.  Dizziness.  Mild abdominal discomfort or pain, such as cramping, gas pain, or feeling bloated.  Soreness around the incision area.  Tiredness.  Pain in your shoulders. Follow these instructions at home: Medicines  Ask your health care provider if the medicine prescribed to you: ? Requires you to avoid driving or using heavy machinery. ? Can cause constipation. You may need to take actions to prevent or treat constipation, such as:  Drink enough fluid to keep your urine pale yellow.  Take over-the-counter or prescription medicines.  Eat foods that are high in fiber, such as beans, whole grains, and fresh fruits and vegetables.  Limit foods that are high in fat and processed sugars, such as fried or sweet foods.  Do not take aspirin because it can cause bleeding. Activity  Rest for the remainder of the day.  Return to your normal activities as told by your health care provider. Ask your health care provider what activities are safe for you.  Do not have sex, douche, or put a tampon or anything else in your vagina for 6 weeks or as long as told by your health care provider.  Do not lift anything that is heavier than your baby for 2 weeks, or the limit that you are told, until your health care provider says that it is safe. Incision care      Follow instructions from your health care provider about how to take care of your incision. Make sure you: ? Wash your hands with soap and water before and after you change your bandage (dressing). If soap and water are not  available, use hand sanitizer. ? Change your dressing as told by your health care provider. ? Leave stitches (sutures), skin glue, or adhesive strips in place. These skin closures may need to stay in place for 2 weeks or longer. If adhesive strip edges start to loosen and curl up, you may trim the loose edges. Do not remove adhesive strips completely unless your health care provider tells you to do that.  Check your incision area every day for signs of infection. Check for: ? Redness, swelling, or pain. ? Fluid or blood. ? Warmth. ? Pus or a bad smell. Other Instructions  Do not take baths, swim, or use a hot tub until your health care provider approves. Ask your health care provider if you may take showers. You may only be allowed to take sponge baths.  Keep all follow-up visits as told by your health care provider. This is important. Contact a health care provider if:  You have redness, swelling, or pain around your incision.  Your incision feels warm to the touch.  Your pain does not improve after 2-3 days.  You have a rash.  You repeatedly become dizzy or lightheaded.  Your pain medicine is not helping.  You are constipated. Get help right away if you:  Have a fever.  Faint.  Have pain in your abdomen that gets worse.  Have fluid or blood coming from your incision.  You have pus or a bad smell coming from your incision.  The  edges of your incision break open after the sutures have been removed.  Have shortness of breath or trouble breathing.  Have chest pain or leg pain.  Have ongoing nausea or diarrhea. Summary  Mild abdominal discomfort is common after this procedure.  Contact your health care provider if you experience problems or have concerns.  Do not lift anything that is heavier than your baby for 2 weeks, or the limit that you are told, until your health care provider says that it is safe.  Keep all follow-up visits as told by your health care  provider. This is important. This information is not intended to replace advice given to you by your health care provider. Make sure you discuss any questions you have with your health care provider. Document Released: 10/17/2011 Document Revised: 03/07/2018 Document Reviewed: 03/07/2018 Elsevier Patient Education  2020 [employer] Postpartum Care After Vaginal Delivery This sheet gives you information about how to care for yourself from the time you deliver your baby to up to 6-12 weeks after delivery (postpartum period). Your health care provider may also give you more specific instructions. If you have problems or questions, contact your health care provider. Follow these instructions at home: Vaginal bleeding  It is normal to have vaginal bleeding (lochia) after delivery. Wear a sanitary pad for vaginal bleeding and discharge. ? During the first week after delivery, the amount and appearance of lochia is often similar to a menstrual period. ? Over the next few weeks, it will gradually decrease to a dry, yellow-brown discharge. ? For most women, lochia stops completely by 4-6 weeks after delivery. Vaginal bleeding can vary from woman to woman.  Change your sanitary pads frequently. Watch for any changes in your flow, such as: ? A sudden increase in volume. ? A change in color. ? Large blood clots.  If you pass a blood clot from your vagina, save it and call your health care provider to discuss. Do not flush blood clots down the toilet before talking with your health care provider.  Do not use tampons or douches until your health care provider says this is safe.  If you are not breastfeeding, your period should return 6-8 weeks after delivery. If you are feeding your child breast milk only (exclusive breastfeeding), your period may not return until you stop breastfeeding. Perineal care  Keep the area between the vagina and the anus (perineum) clean and dry as told by your health care  provider. Use medicated pads and pain-relieving sprays and creams as directed.  If you had a cut in the perineum (episiotomy) or a tear in the vagina, check the area for signs of infection until you are healed. Check for: ? More redness, swelling, or pain. ? Fluid or blood coming from the cut or tear. ? Warmth. ? Pus or a bad smell.  You may be given a squirt bottle to use instead of wiping to clean the perineum area after you go to the bathroom. As you start healing, you may use the squirt bottle before wiping yourself. Make sure to wipe gently.  To relieve pain caused by an episiotomy, a tear in the vagina, or swollen veins in the anus (hemorrhoids), try taking a warm sitz bath 2-3 times a day. A sitz bath is a warm water bath that is taken while you are sitting down. The water should only come up to your hips and should cover your buttocks. Breast care  Within the first few days after delivery, your breasts  may feel heavy, full, and uncomfortable (breast engorgement). Milk may also leak from your breasts. Your health care provider can suggest ways to help relieve the discomfort. Breast engorgement should go away within a few days.  If you are breastfeeding: ? Wear a bra that supports your breasts and fits you well. ? Keep your nipples clean and dry. Apply creams and ointments as told by your health care provider. ? You may need to use breast pads to absorb milk that leaks from your breasts. ? You may have uterine contractions every time you breastfeed for up to several weeks after delivery. Uterine contractions help your uterus return to its normal size. ? If you have any problems with breastfeeding, work with your health care provider or Advertising copywriterlactation consultant.  If you are not breastfeeding: ? Avoid touching your breasts a lot. Doing this can make your breasts produce more milk. ? Wear a good-fitting bra and use cold packs to help with swelling. ? Do not squeeze out (express) milk. This  causes you to make more milk. Intimacy and sexuality  Ask your health care provider when you can engage in sexual activity. This may depend on: ? Your risk of infection. ? How fast you are healing. ? Your comfort and desire to engage in sexual activity.  You are able to get pregnant after delivery, even if you have not had your period. If desired, talk with your health care provider about methods of birth control (contraception). Medicines  Take over-the-counter and prescription medicines only as told by your health care provider.  If you were prescribed an antibiotic medicine, take it as told by your health care provider. Do not stop taking the antibiotic even if you start to feel better. Activity  Gradually return to your normal activities as told by your health care provider. Ask your health care provider what activities are safe for you.  Rest as much as possible. Try to rest or take a nap while your baby is sleeping. Eating and drinking   Drink enough fluid to keep your urine pale yellow.  Eat high-fiber foods every day. These may help prevent or relieve constipation. High-fiber foods include: ? Whole grain cereals and breads. ? Brown rice. ? Beans. ? Fresh fruits and vegetables.  Do not try to lose weight quickly by cutting back on calories.  Take your prenatal vitamins until your postpartum checkup or until your health care provider tells you it is okay to stop. Lifestyle  Do not use any products that contain nicotine or tobacco, such as cigarettes and e-cigarettes. If you need help quitting, ask your health care provider.  Do not drink alcohol, especially if you are breastfeeding. General instructions  Keep all follow-up visits for you and your baby as told by your health care provider. Most women visit their health care provider for a postpartum checkup within the first 3-6 weeks after delivery. Contact a health care provider if:  You feel unable to cope with the  changes that your child brings to your life, and these feelings do not go away.  You feel unusually sad or worried.  Your breasts become red, painful, or hard.  You have a fever.  You have trouble holding urine or keeping urine from leaking.  You have little or no interest in activities you used to enjoy.  You have not breastfed at all and you have not had a menstrual period for 12 weeks after delivery.  You have stopped breastfeeding and you have not  had a menstrual period for 12 weeks after you stopped breastfeeding.  You have questions about caring for yourself or your baby.  You pass a blood clot from your vagina. Get help right away if:  You have chest pain.  You have difficulty breathing.  You have sudden, severe leg pain.  You have severe pain or cramping in your lower abdomen.  You bleed from your vagina so much that you fill more than one sanitary pad in one hour. Bleeding should not be heavier than your heaviest period.  You develop a severe headache.  You faint.  You have blurred vision or spots in your vision.  You have bad-smelling vaginal discharge.  You have thoughts about hurting yourself or your baby. If you ever feel like you may hurt yourself or others, or have thoughts about taking your own life, get help right away. You can go to the nearest emergency department or call:  Your local emergency services (911 in the [place]  A suicide crisis helpline, such as the [facility] at 401 665 4140. This is open 24 hours a day. Summary  The period of time right after you deliver your newborn up to 6-12 weeks after delivery is called the postpartum period.  Gradually return to your normal activities as told by your health care provider.  Keep all follow-up visits for you and your baby as told by your health care provider. This information is not intended to replace advice given to you by your health care provider. Make sure you  discuss any questions you have with your health care provider. Document Released: 02/12/2007 Document Revised: 04/20/2017 Document Reviewed: 01/29/2017 Elsevier Patient Education  2020 [employer].

## 2019-04-29 ENCOUNTER — Telehealth: Payer: Self-pay | Admitting: *Deleted

## 2019-04-29 NOTE — Telephone Encounter (Signed)
Patient stated that she is having swelling in legs and feet since yesterday. Patient was asked of activities, staying off her feet as much as possible, elevating, and drinking water. Patient stated that she had been on her feet all day yesterday putting away Christmas items. Patient was advised by clinical staff to follow previous instructions, take BP after sitting for about 15 minutes while sitting, and send a MYCHART message with BP reading. Patient is to call the office if swelling is not going down after following all instructions. Patient agreed.

## 2019-06-03 ENCOUNTER — Encounter: Payer: BC Managed Care – PPO | Admitting: Certified Nurse Midwife

## 2019-06-03 ENCOUNTER — Telehealth: Payer: Self-pay

## 2019-06-03 NOTE — Telephone Encounter (Signed)
Called pt to begin virtual visit. Pt did not answer. VM left asking pt to call the office.

## 2019-06-04 NOTE — Progress Notes (Signed)
This encounter was created in error - please disregard.

## 2019-06-10 ENCOUNTER — Telehealth: Payer: Self-pay | Admitting: *Deleted

## 2019-06-10 NOTE — Telephone Encounter (Signed)
Left patient an urgent message to call the office to reschedule NO SHOW Postpartum appointment on 06/03/2019.

## 2020-05-11 DIAGNOSIS — Z20822 Contact with and (suspected) exposure to covid-19: Secondary | ICD-10-CM | POA: Diagnosis not present

## 2020-10-26 ENCOUNTER — Ambulatory Visit (INDEPENDENT_AMBULATORY_CARE_PROVIDER_SITE_OTHER): Payer: BC Managed Care – PPO | Admitting: Obstetrics and Gynecology

## 2020-10-26 ENCOUNTER — Other Ambulatory Visit: Payer: Self-pay

## 2020-10-26 ENCOUNTER — Encounter: Payer: Self-pay | Admitting: Obstetrics and Gynecology

## 2020-10-26 VITALS — BP 128/90 | HR 81 | Ht 66.0 in | Wt 138.0 lb

## 2020-10-26 DIAGNOSIS — Z3200 Encounter for pregnancy test, result unknown: Secondary | ICD-10-CM | POA: Diagnosis not present

## 2020-10-26 DIAGNOSIS — Z3202 Encounter for pregnancy test, result negative: Secondary | ICD-10-CM

## 2020-10-26 LAB — POCT URINE PREGNANCY: Preg Test, Ur: NEGATIVE

## 2020-10-26 NOTE — Progress Notes (Signed)
  S:   30 y.o. [id number] with a history of BTL with filshy clips here for pregnancy confirmation. She reports 3 positive pregnancy tests at home that were all positive. Reports recent period this month, however feels pregnant. Denies pain or bleeding.    O: BP 128/90   Pulse 81   Ht 5\' 6"  (1.676 m)   Wt 138 lb (62.6 kg)   LMP 10/16/2020   BMI 22.27 kg/m  Physical Examination: General appearance - alert, well appearing, and in no distress, oriented to person, place, and time and acyanotic, in no respiratory distress  Results for orders placed or performed in visit on 10/26/20 (from the past 48 hour(s))  POCT urine pregnancy     Status: None   Collection Time: 10/26/20  2:45 PM  Result Value Ref Range   Preg Test, Ur Negative Negative    A: Positive pregnancy test at home Office pregnancy test negative.  Hcg level drawn non-stat   10/28/20, NP 10/26/2020 4:28 PM

## 2020-10-26 NOTE — Progress Notes (Signed)
Pt had 3 positive UPT at home Negative UPT in office

## 2020-10-27 ENCOUNTER — Telehealth: Payer: Self-pay | Admitting: *Deleted

## 2020-10-27 LAB — HCG, QUANTITATIVE, PREGNANCY: HCG, Total, QN: <3 m[IU]/mL

## 2020-10-27 NOTE — Telephone Encounter (Signed)
Pt notified of neg BHCG.  She is questioning why her home UPT were faintly positive.  When she showed the UPT in office there was such a faint line that it would be easily questionable.  The other test she did she didn't read for 2 hrs later.  I explained that you have to read the test in the 3 minutes of doing it for accuracy.  I assured her that according to the labwork she is not pregnant.  Her period was lat week and sometimes just the slightest amount of blood can make a test have false results.

## 2021-12-06 DIAGNOSIS — R300 Dysuria: Secondary | ICD-10-CM | POA: Diagnosis not present

## 2021-12-06 DIAGNOSIS — R350 Frequency of micturition: Secondary | ICD-10-CM | POA: Diagnosis not present

## 2021-12-06 DIAGNOSIS — N3080 Other cystitis without hematuria: Secondary | ICD-10-CM | POA: Diagnosis not present

## 2021-12-06 DIAGNOSIS — N3000 Acute cystitis without hematuria: Secondary | ICD-10-CM | POA: Diagnosis not present

## 2022-05-12 DIAGNOSIS — M545 Low back pain, unspecified: Secondary | ICD-10-CM | POA: Diagnosis not present

## 2022-05-12 DIAGNOSIS — Z7689 Persons encountering health services in other specified circumstances: Secondary | ICD-10-CM | POA: Diagnosis not present

## 2022-05-12 DIAGNOSIS — Z833 Family history of diabetes mellitus: Secondary | ICD-10-CM | POA: Diagnosis not present

## 2022-05-12 DIAGNOSIS — I83813 Varicose veins of bilateral lower extremities with pain: Secondary | ICD-10-CM | POA: Diagnosis not present

## 2022-05-12 DIAGNOSIS — Z9851 Tubal ligation status: Secondary | ICD-10-CM | POA: Diagnosis not present

## 2022-05-12 DIAGNOSIS — O09299 Supervision of pregnancy with other poor reproductive or obstetric history, unspecified trimester: Secondary | ICD-10-CM | POA: Diagnosis not present

## 2022-06-16 DIAGNOSIS — I83813 Varicose veins of bilateral lower extremities with pain: Secondary | ICD-10-CM | POA: Diagnosis not present

## 2022-07-13 DIAGNOSIS — N941 Unspecified dyspareunia: Secondary | ICD-10-CM | POA: Diagnosis not present

## 2022-07-13 DIAGNOSIS — R102 Pelvic and perineal pain: Secondary | ICD-10-CM | POA: Diagnosis not present

## 2022-07-13 DIAGNOSIS — N898 Other specified noninflammatory disorders of vagina: Secondary | ICD-10-CM | POA: Diagnosis not present

## 2022-07-13 DIAGNOSIS — Z124 Encounter for screening for malignant neoplasm of cervix: Secondary | ICD-10-CM | POA: Diagnosis not present

## 2022-07-19 DIAGNOSIS — I83813 Varicose veins of bilateral lower extremities with pain: Secondary | ICD-10-CM | POA: Diagnosis not present

## 2022-07-27 DIAGNOSIS — N854 Malposition of uterus: Secondary | ICD-10-CM | POA: Diagnosis not present

## 2022-07-27 DIAGNOSIS — R102 Pelvic and perineal pain: Secondary | ICD-10-CM | POA: Diagnosis not present

## 2022-08-11 DIAGNOSIS — I83813 Varicose veins of bilateral lower extremities with pain: Secondary | ICD-10-CM | POA: Diagnosis not present

## 2022-08-30 DIAGNOSIS — I83813 Varicose veins of bilateral lower extremities with pain: Secondary | ICD-10-CM | POA: Diagnosis not present

## 2022-08-30 DIAGNOSIS — R079 Chest pain, unspecified: Secondary | ICD-10-CM | POA: Diagnosis not present

## 2022-09-05 DIAGNOSIS — R079 Chest pain, unspecified: Secondary | ICD-10-CM | POA: Diagnosis not present

## 2022-09-05 DIAGNOSIS — O09299 Supervision of pregnancy with other poor reproductive or obstetric history, unspecified trimester: Secondary | ICD-10-CM | POA: Diagnosis not present
